# Patient Record
Sex: Female | Born: 1950 | Race: White | Hispanic: No | Smoking: Former smoker
Health system: Southern US, Community
[De-identification: ages and names within clinical notes are randomized; demographics above are authoritative.]

## PROBLEM LIST (undated history)

## (undated) DIAGNOSIS — I639 Cerebral infarction, unspecified: Secondary | ICD-10-CM

## (undated) DIAGNOSIS — C50919 Malignant neoplasm of unspecified site of unspecified female breast: Secondary | ICD-10-CM

## (undated) DIAGNOSIS — I219 Acute myocardial infarction, unspecified: Secondary | ICD-10-CM

## (undated) DIAGNOSIS — F0390 Unspecified dementia without behavioral disturbance: Secondary | ICD-10-CM

---

## 2010-04-08 ENCOUNTER — Inpatient Hospital Stay (HOSPITAL_COMMUNITY)
Admission: RE | Admit: 2010-04-08 | Discharge: 2010-04-09 | Payer: Self-pay | Source: Home / Self Care | Attending: Neurosurgery | Admitting: Neurosurgery

## 2010-05-22 ENCOUNTER — Other Ambulatory Visit: Payer: Self-pay | Admitting: Neurosurgery

## 2010-05-22 DIAGNOSIS — Z9889 Other specified postprocedural states: Secondary | ICD-10-CM

## 2010-06-04 ENCOUNTER — Ambulatory Visit
Admission: RE | Admit: 2010-06-04 | Discharge: 2010-06-04 | Disposition: A | Discharge status: Home / Self Care | Payer: Medicare Other | Source: Ambulatory Visit | Attending: Neurosurgery | Admitting: Neurosurgery

## 2010-06-04 DIAGNOSIS — Z9889 Other specified postprocedural states: Secondary | ICD-10-CM

## 2010-07-08 LAB — BASIC METABOLIC PANEL
BUN: 12 mg/dL (ref 6–23)
CO2: 28 mEq/L (ref 19–32)
Chloride: 107 mEq/L (ref 96–112)
Creatinine, Ser: 0.88 mg/dL (ref 0.4–1.2)
Glucose, Bld: 96 mg/dL (ref 70–99)
Potassium: 4.5 mEq/L (ref 3.5–5.1)

## 2010-07-08 LAB — CBC
HCT: 41.1 % (ref 36.0–46.0)
MCH: 28.9 pg (ref 26.0–34.0)
MCV: 88 fL (ref 78.0–100.0)
Platelets: 257 10*3/uL (ref 150–400)
RDW: 13.7 % (ref 11.5–15.5)
WBC: 11.9 10*3/uL — ABNORMAL HIGH (ref 4.0–10.5)

## 2010-07-08 LAB — TYPE AND SCREEN: ABO/RH(D): O NEG

## 2010-07-08 LAB — DIFFERENTIAL
Basophils Absolute: 0.1 10*3/uL (ref 0.0–0.1)
Eosinophils Absolute: 0.4 10*3/uL (ref 0.0–0.7)
Eosinophils Relative: 3 % (ref 0–5)
Lymphocytes Relative: 41 % (ref 12–46)
Lymphs Abs: 4.9 10*3/uL — ABNORMAL HIGH (ref 0.7–4.0)
Monocytes Absolute: 0.9 10*3/uL (ref 0.1–1.0)

## 2010-07-08 LAB — SURGICAL PCR SCREEN: Staphylococcus aureus: NEGATIVE

## 2010-08-06 NOTE — Discharge Summary (Signed)
  NAMEIZORA, BENN                ACCOUNT NO.:  [id number]  MEDICAL RECORD NO.:  [id number]           PATIENT TYPE:  I  LOCATION:  3534                         FACILITY:  [facility]  PHYSICIAN:  Kathaleen Maser. Santa Abdelrahman, M.D.    DATE OF BIRTH:  February 21, 1951  DATE OF ADMISSION:  04/08/2010 DATE OF DISCHARGE:  04/09/2010                              DISCHARGE SUMMARY   ATTENDING PHYSICIAN:  Sherilyn Cooter A. Remmington Teters, MD  SERVICE:  Neurosurgery  FINAL DIAGNOSIS:  Cervical stenosis with myelopathy.  HISTORY OF PRESENT ILLNESS:  Ms. Kolle is a 60 year old female with cervical stenosis who presents now for a C4-5, C5-6, and C6-7 anterior cervical decompression and fusion.  HOSPITAL COURSE:  The patient admitted to the hospital where she underwent uncomplicated three-level anterior cervical decompression and fusion.  Postoperatively, the patient has done well.  She is ready be discharged on her first postoperative day.  She is tolerating regular diet, ambulating without difficulty, and neurologically intact.  CONDITION ON DISCHARGE:  Improved.  DISCHARGE DISPOSITION:  The patient will follow up in my office in 1 week.          ______________________________ Kathaleen Maser. Kiriana Worthington, M.D.     HAP/MEDQ  D:  07/24/2010  T:  07/25/2010  Job:  [id number]  Electronically Signed by Julio Sicks M.D. on 08/06/2010 03:23:42 PM

## 2011-05-12 DIAGNOSIS — R5381 Other malaise: Secondary | ICD-10-CM | POA: Diagnosis not present

## 2011-05-12 DIAGNOSIS — R5383 Other fatigue: Secondary | ICD-10-CM | POA: Diagnosis not present

## 2011-05-12 DIAGNOSIS — M159 Polyosteoarthritis, unspecified: Secondary | ICD-10-CM | POA: Diagnosis not present

## 2011-05-12 DIAGNOSIS — J449 Chronic obstructive pulmonary disease, unspecified: Secondary | ICD-10-CM | POA: Diagnosis not present

## 2011-06-01 DIAGNOSIS — F331 Major depressive disorder, recurrent, moderate: Secondary | ICD-10-CM | POA: Diagnosis not present

## 2011-06-02 DIAGNOSIS — I059 Rheumatic mitral valve disease, unspecified: Secondary | ICD-10-CM | POA: Diagnosis not present

## 2011-06-02 DIAGNOSIS — G609 Hereditary and idiopathic neuropathy, unspecified: Secondary | ICD-10-CM | POA: Diagnosis not present

## 2011-06-02 DIAGNOSIS — E785 Hyperlipidemia, unspecified: Secondary | ICD-10-CM | POA: Diagnosis not present

## 2011-06-02 DIAGNOSIS — I2510 Atherosclerotic heart disease of native coronary artery without angina pectoris: Secondary | ICD-10-CM | POA: Diagnosis not present

## 2011-06-18 DIAGNOSIS — J449 Chronic obstructive pulmonary disease, unspecified: Secondary | ICD-10-CM | POA: Diagnosis not present

## 2011-06-18 DIAGNOSIS — R5383 Other fatigue: Secondary | ICD-10-CM | POA: Diagnosis not present

## 2011-06-18 DIAGNOSIS — I10 Essential (primary) hypertension: Secondary | ICD-10-CM | POA: Diagnosis not present

## 2011-06-18 DIAGNOSIS — Z0000 Encounter for general adult medical examination without abnormal findings: Secondary | ICD-10-CM | POA: Diagnosis not present

## 2011-06-18 DIAGNOSIS — R7301 Impaired fasting glucose: Secondary | ICD-10-CM | POA: Diagnosis not present

## 2011-06-18 DIAGNOSIS — E785 Hyperlipidemia, unspecified: Secondary | ICD-10-CM | POA: Diagnosis not present

## 2011-06-18 DIAGNOSIS — E559 Vitamin D deficiency, unspecified: Secondary | ICD-10-CM | POA: Diagnosis not present

## 2011-06-18 DIAGNOSIS — Z79899 Other long term (current) drug therapy: Secondary | ICD-10-CM | POA: Diagnosis not present

## 2011-06-24 DIAGNOSIS — M949 Disorder of cartilage, unspecified: Secondary | ICD-10-CM | POA: Diagnosis not present

## 2011-06-24 DIAGNOSIS — Z13820 Encounter for screening for osteoporosis: Secondary | ICD-10-CM | POA: Diagnosis not present

## 2011-06-24 DIAGNOSIS — M899 Disorder of bone, unspecified: Secondary | ICD-10-CM | POA: Diagnosis not present

## 2011-08-12 DIAGNOSIS — D0590 Unspecified type of carcinoma in situ of unspecified breast: Secondary | ICD-10-CM | POA: Diagnosis not present

## 2011-08-12 DIAGNOSIS — R928 Other abnormal and inconclusive findings on diagnostic imaging of breast: Secondary | ICD-10-CM | POA: Diagnosis not present

## 2011-08-14 DIAGNOSIS — R110 Nausea: Secondary | ICD-10-CM | POA: Diagnosis not present

## 2011-08-14 DIAGNOSIS — G43109 Migraine with aura, not intractable, without status migrainosus: Secondary | ICD-10-CM | POA: Diagnosis not present

## 2011-08-14 DIAGNOSIS — M47812 Spondylosis without myelopathy or radiculopathy, cervical region: Secondary | ICD-10-CM | POA: Diagnosis not present

## 2011-08-14 DIAGNOSIS — J449 Chronic obstructive pulmonary disease, unspecified: Secondary | ICD-10-CM | POA: Diagnosis not present

## 2011-08-18 DIAGNOSIS — C50919 Malignant neoplasm of unspecified site of unspecified female breast: Secondary | ICD-10-CM | POA: Diagnosis not present

## 2011-08-19 DIAGNOSIS — C50919 Malignant neoplasm of unspecified site of unspecified female breast: Secondary | ICD-10-CM | POA: Diagnosis not present

## 2011-08-27 DIAGNOSIS — C50919 Malignant neoplasm of unspecified site of unspecified female breast: Secondary | ICD-10-CM | POA: Diagnosis not present

## 2011-08-28 DIAGNOSIS — F17200 Nicotine dependence, unspecified, uncomplicated: Secondary | ICD-10-CM | POA: Diagnosis not present

## 2011-08-28 DIAGNOSIS — I2510 Atherosclerotic heart disease of native coronary artery without angina pectoris: Secondary | ICD-10-CM | POA: Diagnosis not present

## 2011-08-28 DIAGNOSIS — I10 Essential (primary) hypertension: Secondary | ICD-10-CM | POA: Diagnosis not present

## 2011-08-28 DIAGNOSIS — Z01810 Encounter for preprocedural cardiovascular examination: Secondary | ICD-10-CM | POA: Diagnosis not present

## 2011-08-28 DIAGNOSIS — I059 Rheumatic mitral valve disease, unspecified: Secondary | ICD-10-CM | POA: Diagnosis not present

## 2011-08-28 DIAGNOSIS — E785 Hyperlipidemia, unspecified: Secondary | ICD-10-CM | POA: Diagnosis not present

## 2011-09-04 DIAGNOSIS — I059 Rheumatic mitral valve disease, unspecified: Secondary | ICD-10-CM | POA: Diagnosis not present

## 2011-09-04 DIAGNOSIS — I2510 Atherosclerotic heart disease of native coronary artery without angina pectoris: Secondary | ICD-10-CM | POA: Diagnosis not present

## 2011-09-04 DIAGNOSIS — Z01810 Encounter for preprocedural cardiovascular examination: Secondary | ICD-10-CM | POA: Diagnosis not present

## 2011-09-11 DIAGNOSIS — J449 Chronic obstructive pulmonary disease, unspecified: Secondary | ICD-10-CM | POA: Diagnosis not present

## 2011-09-11 DIAGNOSIS — E785 Hyperlipidemia, unspecified: Secondary | ICD-10-CM | POA: Diagnosis not present

## 2011-09-11 DIAGNOSIS — K219 Gastro-esophageal reflux disease without esophagitis: Secondary | ICD-10-CM | POA: Diagnosis not present

## 2011-09-11 DIAGNOSIS — Z7982 Long term (current) use of aspirin: Secondary | ICD-10-CM | POA: Diagnosis not present

## 2011-09-11 DIAGNOSIS — D0590 Unspecified type of carcinoma in situ of unspecified breast: Secondary | ICD-10-CM | POA: Diagnosis not present

## 2011-09-11 DIAGNOSIS — I252 Old myocardial infarction: Secondary | ICD-10-CM | POA: Diagnosis not present

## 2011-09-11 DIAGNOSIS — F17200 Nicotine dependence, unspecified, uncomplicated: Secondary | ICD-10-CM | POA: Diagnosis not present

## 2011-09-11 DIAGNOSIS — I10 Essential (primary) hypertension: Secondary | ICD-10-CM | POA: Diagnosis not present

## 2011-10-06 DIAGNOSIS — M25529 Pain in unspecified elbow: Secondary | ICD-10-CM | POA: Diagnosis not present

## 2011-10-06 DIAGNOSIS — J209 Acute bronchitis, unspecified: Secondary | ICD-10-CM | POA: Diagnosis not present

## 2011-10-15 DIAGNOSIS — K589 Irritable bowel syndrome without diarrhea: Secondary | ICD-10-CM | POA: Diagnosis not present

## 2011-10-15 DIAGNOSIS — Z79899 Other long term (current) drug therapy: Secondary | ICD-10-CM | POA: Diagnosis not present

## 2011-10-15 DIAGNOSIS — C50419 Malignant neoplasm of upper-outer quadrant of unspecified female breast: Secondary | ICD-10-CM | POA: Diagnosis not present

## 2011-10-15 DIAGNOSIS — D0590 Unspecified type of carcinoma in situ of unspecified breast: Secondary | ICD-10-CM | POA: Diagnosis not present

## 2011-10-15 DIAGNOSIS — I10 Essential (primary) hypertension: Secondary | ICD-10-CM | POA: Diagnosis not present

## 2011-10-15 DIAGNOSIS — E785 Hyperlipidemia, unspecified: Secondary | ICD-10-CM | POA: Diagnosis not present

## 2011-10-15 DIAGNOSIS — Z803 Family history of malignant neoplasm of breast: Secondary | ICD-10-CM | POA: Diagnosis not present

## 2011-10-15 DIAGNOSIS — K219 Gastro-esophageal reflux disease without esophagitis: Secondary | ICD-10-CM | POA: Diagnosis not present

## 2011-10-15 DIAGNOSIS — I059 Rheumatic mitral valve disease, unspecified: Secondary | ICD-10-CM | POA: Diagnosis not present

## 2011-10-15 DIAGNOSIS — Z7982 Long term (current) use of aspirin: Secondary | ICD-10-CM | POA: Diagnosis not present

## 2011-10-15 DIAGNOSIS — F17200 Nicotine dependence, unspecified, uncomplicated: Secondary | ICD-10-CM | POA: Diagnosis not present

## 2011-10-15 DIAGNOSIS — I252 Old myocardial infarction: Secondary | ICD-10-CM | POA: Diagnosis not present

## 2011-10-19 DIAGNOSIS — C50419 Malignant neoplasm of upper-outer quadrant of unspecified female breast: Secondary | ICD-10-CM | POA: Diagnosis not present

## 2011-10-19 DIAGNOSIS — M899 Disorder of bone, unspecified: Secondary | ICD-10-CM | POA: Diagnosis not present

## 2011-10-19 DIAGNOSIS — IMO0002 Reserved for concepts with insufficient information to code with codable children: Secondary | ICD-10-CM | POA: Diagnosis not present

## 2011-10-19 DIAGNOSIS — M949 Disorder of cartilage, unspecified: Secondary | ICD-10-CM | POA: Diagnosis not present

## 2011-10-20 DIAGNOSIS — D649 Anemia, unspecified: Secondary | ICD-10-CM | POA: Diagnosis not present

## 2011-10-20 DIAGNOSIS — R5381 Other malaise: Secondary | ICD-10-CM | POA: Diagnosis not present

## 2011-10-20 DIAGNOSIS — N644 Mastodynia: Secondary | ICD-10-CM | POA: Diagnosis not present

## 2011-10-20 DIAGNOSIS — R5383 Other fatigue: Secondary | ICD-10-CM | POA: Diagnosis not present

## 2011-10-22 DIAGNOSIS — N644 Mastodynia: Secondary | ICD-10-CM | POA: Diagnosis not present

## 2011-10-22 DIAGNOSIS — IMO0002 Reserved for concepts with insufficient information to code with codable children: Secondary | ICD-10-CM | POA: Diagnosis not present

## 2011-11-12 DIAGNOSIS — C50419 Malignant neoplasm of upper-outer quadrant of unspecified female breast: Secondary | ICD-10-CM | POA: Diagnosis not present

## 2011-11-12 DIAGNOSIS — D0590 Unspecified type of carcinoma in situ of unspecified breast: Secondary | ICD-10-CM | POA: Diagnosis not present

## 2011-11-12 DIAGNOSIS — Z510 Encounter for antineoplastic radiation therapy: Secondary | ICD-10-CM | POA: Diagnosis not present

## 2011-11-17 DIAGNOSIS — C50419 Malignant neoplasm of upper-outer quadrant of unspecified female breast: Secondary | ICD-10-CM | POA: Diagnosis not present

## 2011-11-17 DIAGNOSIS — Z510 Encounter for antineoplastic radiation therapy: Secondary | ICD-10-CM | POA: Diagnosis not present

## 2011-11-17 DIAGNOSIS — D0590 Unspecified type of carcinoma in situ of unspecified breast: Secondary | ICD-10-CM | POA: Diagnosis not present

## 2011-11-18 DIAGNOSIS — C50419 Malignant neoplasm of upper-outer quadrant of unspecified female breast: Secondary | ICD-10-CM | POA: Diagnosis not present

## 2011-11-18 DIAGNOSIS — D0590 Unspecified type of carcinoma in situ of unspecified breast: Secondary | ICD-10-CM | POA: Diagnosis not present

## 2011-11-18 DIAGNOSIS — Z510 Encounter for antineoplastic radiation therapy: Secondary | ICD-10-CM | POA: Diagnosis not present

## 2011-11-23 DIAGNOSIS — Z510 Encounter for antineoplastic radiation therapy: Secondary | ICD-10-CM | POA: Diagnosis not present

## 2011-11-23 DIAGNOSIS — C50419 Malignant neoplasm of upper-outer quadrant of unspecified female breast: Secondary | ICD-10-CM | POA: Diagnosis not present

## 2011-11-23 DIAGNOSIS — D0590 Unspecified type of carcinoma in situ of unspecified breast: Secondary | ICD-10-CM | POA: Diagnosis not present

## 2011-11-24 DIAGNOSIS — Z510 Encounter for antineoplastic radiation therapy: Secondary | ICD-10-CM | POA: Diagnosis not present

## 2011-11-24 DIAGNOSIS — D0590 Unspecified type of carcinoma in situ of unspecified breast: Secondary | ICD-10-CM | POA: Diagnosis not present

## 2011-11-25 DIAGNOSIS — D0590 Unspecified type of carcinoma in situ of unspecified breast: Secondary | ICD-10-CM | POA: Diagnosis not present

## 2011-11-25 DIAGNOSIS — Z510 Encounter for antineoplastic radiation therapy: Secondary | ICD-10-CM | POA: Diagnosis not present

## 2011-11-26 DIAGNOSIS — Z510 Encounter for antineoplastic radiation therapy: Secondary | ICD-10-CM | POA: Diagnosis not present

## 2011-11-26 DIAGNOSIS — D0590 Unspecified type of carcinoma in situ of unspecified breast: Secondary | ICD-10-CM | POA: Diagnosis not present

## 2011-11-27 DIAGNOSIS — D0590 Unspecified type of carcinoma in situ of unspecified breast: Secondary | ICD-10-CM | POA: Diagnosis not present

## 2011-11-27 DIAGNOSIS — Z510 Encounter for antineoplastic radiation therapy: Secondary | ICD-10-CM | POA: Diagnosis not present

## 2011-11-30 DIAGNOSIS — D0590 Unspecified type of carcinoma in situ of unspecified breast: Secondary | ICD-10-CM | POA: Diagnosis not present

## 2011-11-30 DIAGNOSIS — C50419 Malignant neoplasm of upper-outer quadrant of unspecified female breast: Secondary | ICD-10-CM | POA: Diagnosis not present

## 2011-11-30 DIAGNOSIS — Z510 Encounter for antineoplastic radiation therapy: Secondary | ICD-10-CM | POA: Diagnosis not present

## 2011-12-01 DIAGNOSIS — Z510 Encounter for antineoplastic radiation therapy: Secondary | ICD-10-CM | POA: Diagnosis not present

## 2011-12-01 DIAGNOSIS — D0590 Unspecified type of carcinoma in situ of unspecified breast: Secondary | ICD-10-CM | POA: Diagnosis not present

## 2011-12-02 DIAGNOSIS — Z510 Encounter for antineoplastic radiation therapy: Secondary | ICD-10-CM | POA: Diagnosis not present

## 2011-12-02 DIAGNOSIS — D0590 Unspecified type of carcinoma in situ of unspecified breast: Secondary | ICD-10-CM | POA: Diagnosis not present

## 2011-12-03 DIAGNOSIS — Z510 Encounter for antineoplastic radiation therapy: Secondary | ICD-10-CM | POA: Diagnosis not present

## 2011-12-03 DIAGNOSIS — D0590 Unspecified type of carcinoma in situ of unspecified breast: Secondary | ICD-10-CM | POA: Diagnosis not present

## 2011-12-04 DIAGNOSIS — Z510 Encounter for antineoplastic radiation therapy: Secondary | ICD-10-CM | POA: Diagnosis not present

## 2011-12-04 DIAGNOSIS — D0590 Unspecified type of carcinoma in situ of unspecified breast: Secondary | ICD-10-CM | POA: Diagnosis not present

## 2011-12-07 DIAGNOSIS — D0590 Unspecified type of carcinoma in situ of unspecified breast: Secondary | ICD-10-CM | POA: Diagnosis not present

## 2011-12-07 DIAGNOSIS — C50419 Malignant neoplasm of upper-outer quadrant of unspecified female breast: Secondary | ICD-10-CM | POA: Diagnosis not present

## 2011-12-07 DIAGNOSIS — Z510 Encounter for antineoplastic radiation therapy: Secondary | ICD-10-CM | POA: Diagnosis not present

## 2011-12-08 DIAGNOSIS — D0590 Unspecified type of carcinoma in situ of unspecified breast: Secondary | ICD-10-CM | POA: Diagnosis not present

## 2011-12-08 DIAGNOSIS — Z510 Encounter for antineoplastic radiation therapy: Secondary | ICD-10-CM | POA: Diagnosis not present

## 2011-12-09 DIAGNOSIS — Z510 Encounter for antineoplastic radiation therapy: Secondary | ICD-10-CM | POA: Diagnosis not present

## 2011-12-09 DIAGNOSIS — D0590 Unspecified type of carcinoma in situ of unspecified breast: Secondary | ICD-10-CM | POA: Diagnosis not present

## 2011-12-10 DIAGNOSIS — Z510 Encounter for antineoplastic radiation therapy: Secondary | ICD-10-CM | POA: Diagnosis not present

## 2011-12-10 DIAGNOSIS — D0590 Unspecified type of carcinoma in situ of unspecified breast: Secondary | ICD-10-CM | POA: Diagnosis not present

## 2011-12-11 DIAGNOSIS — Z510 Encounter for antineoplastic radiation therapy: Secondary | ICD-10-CM | POA: Diagnosis not present

## 2011-12-11 DIAGNOSIS — D0590 Unspecified type of carcinoma in situ of unspecified breast: Secondary | ICD-10-CM | POA: Diagnosis not present

## 2011-12-14 DIAGNOSIS — D0590 Unspecified type of carcinoma in situ of unspecified breast: Secondary | ICD-10-CM | POA: Diagnosis not present

## 2011-12-14 DIAGNOSIS — C50419 Malignant neoplasm of upper-outer quadrant of unspecified female breast: Secondary | ICD-10-CM | POA: Diagnosis not present

## 2011-12-14 DIAGNOSIS — Z510 Encounter for antineoplastic radiation therapy: Secondary | ICD-10-CM | POA: Diagnosis not present

## 2011-12-15 DIAGNOSIS — Z510 Encounter for antineoplastic radiation therapy: Secondary | ICD-10-CM | POA: Diagnosis not present

## 2011-12-15 DIAGNOSIS — D0590 Unspecified type of carcinoma in situ of unspecified breast: Secondary | ICD-10-CM | POA: Diagnosis not present

## 2011-12-16 DIAGNOSIS — D0590 Unspecified type of carcinoma in situ of unspecified breast: Secondary | ICD-10-CM | POA: Diagnosis not present

## 2011-12-16 DIAGNOSIS — Z510 Encounter for antineoplastic radiation therapy: Secondary | ICD-10-CM | POA: Diagnosis not present

## 2011-12-17 DIAGNOSIS — D0590 Unspecified type of carcinoma in situ of unspecified breast: Secondary | ICD-10-CM | POA: Diagnosis not present

## 2011-12-17 DIAGNOSIS — Z510 Encounter for antineoplastic radiation therapy: Secondary | ICD-10-CM | POA: Diagnosis not present

## 2011-12-18 DIAGNOSIS — D0590 Unspecified type of carcinoma in situ of unspecified breast: Secondary | ICD-10-CM | POA: Diagnosis not present

## 2011-12-18 DIAGNOSIS — Z510 Encounter for antineoplastic radiation therapy: Secondary | ICD-10-CM | POA: Diagnosis not present

## 2011-12-21 DIAGNOSIS — Z510 Encounter for antineoplastic radiation therapy: Secondary | ICD-10-CM | POA: Diagnosis not present

## 2011-12-21 DIAGNOSIS — C50419 Malignant neoplasm of upper-outer quadrant of unspecified female breast: Secondary | ICD-10-CM | POA: Diagnosis not present

## 2011-12-21 DIAGNOSIS — D0590 Unspecified type of carcinoma in situ of unspecified breast: Secondary | ICD-10-CM | POA: Diagnosis not present

## 2011-12-22 DIAGNOSIS — D0590 Unspecified type of carcinoma in situ of unspecified breast: Secondary | ICD-10-CM | POA: Diagnosis not present

## 2011-12-22 DIAGNOSIS — Z510 Encounter for antineoplastic radiation therapy: Secondary | ICD-10-CM | POA: Diagnosis not present

## 2011-12-23 DIAGNOSIS — Z510 Encounter for antineoplastic radiation therapy: Secondary | ICD-10-CM | POA: Diagnosis not present

## 2011-12-23 DIAGNOSIS — D0590 Unspecified type of carcinoma in situ of unspecified breast: Secondary | ICD-10-CM | POA: Diagnosis not present

## 2011-12-24 DIAGNOSIS — Z853 Personal history of malignant neoplasm of breast: Secondary | ICD-10-CM | POA: Diagnosis not present

## 2011-12-24 DIAGNOSIS — D0590 Unspecified type of carcinoma in situ of unspecified breast: Secondary | ICD-10-CM | POA: Diagnosis not present

## 2011-12-24 DIAGNOSIS — Z510 Encounter for antineoplastic radiation therapy: Secondary | ICD-10-CM | POA: Diagnosis not present

## 2011-12-25 DIAGNOSIS — Z510 Encounter for antineoplastic radiation therapy: Secondary | ICD-10-CM | POA: Diagnosis not present

## 2011-12-25 DIAGNOSIS — D0590 Unspecified type of carcinoma in situ of unspecified breast: Secondary | ICD-10-CM | POA: Diagnosis not present

## 2011-12-29 DIAGNOSIS — D0590 Unspecified type of carcinoma in situ of unspecified breast: Secondary | ICD-10-CM | POA: Diagnosis not present

## 2011-12-29 DIAGNOSIS — Z510 Encounter for antineoplastic radiation therapy: Secondary | ICD-10-CM | POA: Diagnosis not present

## 2011-12-29 DIAGNOSIS — C50419 Malignant neoplasm of upper-outer quadrant of unspecified female breast: Secondary | ICD-10-CM | POA: Diagnosis not present

## 2011-12-30 DIAGNOSIS — J449 Chronic obstructive pulmonary disease, unspecified: Secondary | ICD-10-CM | POA: Diagnosis not present

## 2011-12-30 DIAGNOSIS — D0590 Unspecified type of carcinoma in situ of unspecified breast: Secondary | ICD-10-CM | POA: Diagnosis not present

## 2011-12-30 DIAGNOSIS — Z510 Encounter for antineoplastic radiation therapy: Secondary | ICD-10-CM | POA: Diagnosis not present

## 2011-12-30 DIAGNOSIS — R5383 Other fatigue: Secondary | ICD-10-CM | POA: Diagnosis not present

## 2011-12-30 DIAGNOSIS — J309 Allergic rhinitis, unspecified: Secondary | ICD-10-CM | POA: Diagnosis not present

## 2011-12-31 DIAGNOSIS — Z510 Encounter for antineoplastic radiation therapy: Secondary | ICD-10-CM | POA: Diagnosis not present

## 2011-12-31 DIAGNOSIS — D0590 Unspecified type of carcinoma in situ of unspecified breast: Secondary | ICD-10-CM | POA: Diagnosis not present

## 2012-01-07 DIAGNOSIS — T8189XA Other complications of procedures, not elsewhere classified, initial encounter: Secondary | ICD-10-CM | POA: Diagnosis not present

## 2012-01-07 DIAGNOSIS — M2010 Hallux valgus (acquired), unspecified foot: Secondary | ICD-10-CM | POA: Diagnosis not present

## 2012-01-07 DIAGNOSIS — B079 Viral wart, unspecified: Secondary | ICD-10-CM | POA: Diagnosis not present

## 2012-01-13 DIAGNOSIS — T8189XA Other complications of procedures, not elsewhere classified, initial encounter: Secondary | ICD-10-CM | POA: Diagnosis not present

## 2012-01-20 DIAGNOSIS — B070 Plantar wart: Secondary | ICD-10-CM | POA: Diagnosis not present

## 2012-01-20 DIAGNOSIS — M79609 Pain in unspecified limb: Secondary | ICD-10-CM | POA: Diagnosis not present

## 2012-01-21 DIAGNOSIS — F17200 Nicotine dependence, unspecified, uncomplicated: Secondary | ICD-10-CM | POA: Diagnosis not present

## 2012-01-21 DIAGNOSIS — M899 Disorder of bone, unspecified: Secondary | ICD-10-CM | POA: Diagnosis not present

## 2012-01-21 DIAGNOSIS — C50419 Malignant neoplasm of upper-outer quadrant of unspecified female breast: Secondary | ICD-10-CM | POA: Diagnosis not present

## 2012-01-22 DIAGNOSIS — J209 Acute bronchitis, unspecified: Secondary | ICD-10-CM | POA: Diagnosis not present

## 2012-01-22 DIAGNOSIS — C50919 Malignant neoplasm of unspecified site of unspecified female breast: Secondary | ICD-10-CM | POA: Diagnosis not present

## 2012-01-26 DIAGNOSIS — R0609 Other forms of dyspnea: Secondary | ICD-10-CM | POA: Diagnosis not present

## 2012-01-26 DIAGNOSIS — C50419 Malignant neoplasm of upper-outer quadrant of unspecified female breast: Secondary | ICD-10-CM | POA: Diagnosis not present

## 2012-01-26 DIAGNOSIS — R0989 Other specified symptoms and signs involving the circulatory and respiratory systems: Secondary | ICD-10-CM | POA: Diagnosis not present

## 2012-01-29 DIAGNOSIS — E785 Hyperlipidemia, unspecified: Secondary | ICD-10-CM | POA: Diagnosis not present

## 2012-01-29 DIAGNOSIS — Z7982 Long term (current) use of aspirin: Secondary | ICD-10-CM | POA: Diagnosis not present

## 2012-01-29 DIAGNOSIS — D0590 Unspecified type of carcinoma in situ of unspecified breast: Secondary | ICD-10-CM | POA: Diagnosis not present

## 2012-01-29 DIAGNOSIS — N61 Mastitis without abscess: Secondary | ICD-10-CM | POA: Diagnosis not present

## 2012-01-29 DIAGNOSIS — I2510 Atherosclerotic heart disease of native coronary artery without angina pectoris: Secondary | ICD-10-CM | POA: Diagnosis not present

## 2012-01-29 DIAGNOSIS — G603 Idiopathic progressive neuropathy: Secondary | ICD-10-CM | POA: Diagnosis not present

## 2012-01-29 DIAGNOSIS — I059 Rheumatic mitral valve disease, unspecified: Secondary | ICD-10-CM | POA: Diagnosis not present

## 2012-01-29 DIAGNOSIS — F17200 Nicotine dependence, unspecified, uncomplicated: Secondary | ICD-10-CM | POA: Diagnosis not present

## 2012-01-29 DIAGNOSIS — L98499 Non-pressure chronic ulcer of skin of other sites with unspecified severity: Secondary | ICD-10-CM | POA: Diagnosis not present

## 2012-01-29 DIAGNOSIS — Z79899 Other long term (current) drug therapy: Secondary | ICD-10-CM | POA: Diagnosis not present

## 2012-01-29 DIAGNOSIS — G609 Hereditary and idiopathic neuropathy, unspecified: Secondary | ICD-10-CM | POA: Diagnosis not present

## 2012-01-29 DIAGNOSIS — I10 Essential (primary) hypertension: Secondary | ICD-10-CM | POA: Diagnosis not present

## 2012-01-29 DIAGNOSIS — T8131XA Disruption of external operation (surgical) wound, not elsewhere classified, initial encounter: Secondary | ICD-10-CM | POA: Diagnosis not present

## 2012-02-02 DIAGNOSIS — I2510 Atherosclerotic heart disease of native coronary artery without angina pectoris: Secondary | ICD-10-CM | POA: Diagnosis not present

## 2012-02-02 DIAGNOSIS — I10 Essential (primary) hypertension: Secondary | ICD-10-CM | POA: Diagnosis not present

## 2012-02-02 DIAGNOSIS — L98499 Non-pressure chronic ulcer of skin of other sites with unspecified severity: Secondary | ICD-10-CM | POA: Diagnosis not present

## 2012-02-02 DIAGNOSIS — T8131XA Disruption of external operation (surgical) wound, not elsewhere classified, initial encounter: Secondary | ICD-10-CM | POA: Diagnosis not present

## 2012-02-02 DIAGNOSIS — Z7982 Long term (current) use of aspirin: Secondary | ICD-10-CM | POA: Diagnosis not present

## 2012-02-02 DIAGNOSIS — N61 Mastitis without abscess: Secondary | ICD-10-CM | POA: Diagnosis not present

## 2012-02-03 DIAGNOSIS — B070 Plantar wart: Secondary | ICD-10-CM | POA: Diagnosis not present

## 2012-02-03 DIAGNOSIS — M79609 Pain in unspecified limb: Secondary | ICD-10-CM | POA: Diagnosis not present

## 2012-02-04 DIAGNOSIS — N61 Mastitis without abscess: Secondary | ICD-10-CM | POA: Diagnosis not present

## 2012-02-04 DIAGNOSIS — I2510 Atherosclerotic heart disease of native coronary artery without angina pectoris: Secondary | ICD-10-CM | POA: Diagnosis not present

## 2012-02-04 DIAGNOSIS — L98499 Non-pressure chronic ulcer of skin of other sites with unspecified severity: Secondary | ICD-10-CM | POA: Diagnosis not present

## 2012-02-04 DIAGNOSIS — I10 Essential (primary) hypertension: Secondary | ICD-10-CM | POA: Diagnosis not present

## 2012-02-04 DIAGNOSIS — T8131XA Disruption of external operation (surgical) wound, not elsewhere classified, initial encounter: Secondary | ICD-10-CM | POA: Diagnosis not present

## 2012-02-04 DIAGNOSIS — Z7982 Long term (current) use of aspirin: Secondary | ICD-10-CM | POA: Diagnosis not present

## 2012-02-11 DIAGNOSIS — T8131XA Disruption of external operation (surgical) wound, not elsewhere classified, initial encounter: Secondary | ICD-10-CM | POA: Diagnosis not present

## 2012-02-11 DIAGNOSIS — I10 Essential (primary) hypertension: Secondary | ICD-10-CM | POA: Diagnosis not present

## 2012-02-11 DIAGNOSIS — L98499 Non-pressure chronic ulcer of skin of other sites with unspecified severity: Secondary | ICD-10-CM | POA: Diagnosis not present

## 2012-02-11 DIAGNOSIS — I2510 Atherosclerotic heart disease of native coronary artery without angina pectoris: Secondary | ICD-10-CM | POA: Diagnosis not present

## 2012-02-11 DIAGNOSIS — Z7982 Long term (current) use of aspirin: Secondary | ICD-10-CM | POA: Diagnosis not present

## 2012-02-11 DIAGNOSIS — N61 Mastitis without abscess: Secondary | ICD-10-CM | POA: Diagnosis not present

## 2012-02-16 DIAGNOSIS — L98499 Non-pressure chronic ulcer of skin of other sites with unspecified severity: Secondary | ICD-10-CM | POA: Diagnosis not present

## 2012-02-16 DIAGNOSIS — Z7982 Long term (current) use of aspirin: Secondary | ICD-10-CM | POA: Diagnosis not present

## 2012-02-16 DIAGNOSIS — N61 Mastitis without abscess: Secondary | ICD-10-CM | POA: Diagnosis not present

## 2012-02-16 DIAGNOSIS — T8131XA Disruption of external operation (surgical) wound, not elsewhere classified, initial encounter: Secondary | ICD-10-CM | POA: Diagnosis not present

## 2012-02-16 DIAGNOSIS — I10 Essential (primary) hypertension: Secondary | ICD-10-CM | POA: Diagnosis not present

## 2012-02-16 DIAGNOSIS — I2510 Atherosclerotic heart disease of native coronary artery without angina pectoris: Secondary | ICD-10-CM | POA: Diagnosis not present

## 2012-02-17 DIAGNOSIS — B070 Plantar wart: Secondary | ICD-10-CM | POA: Diagnosis not present

## 2012-02-17 DIAGNOSIS — M79609 Pain in unspecified limb: Secondary | ICD-10-CM | POA: Diagnosis not present

## 2012-02-24 DIAGNOSIS — I2510 Atherosclerotic heart disease of native coronary artery without angina pectoris: Secondary | ICD-10-CM | POA: Diagnosis not present

## 2012-02-24 DIAGNOSIS — Z7982 Long term (current) use of aspirin: Secondary | ICD-10-CM | POA: Diagnosis not present

## 2012-02-24 DIAGNOSIS — I10 Essential (primary) hypertension: Secondary | ICD-10-CM | POA: Diagnosis not present

## 2012-02-24 DIAGNOSIS — N61 Mastitis without abscess: Secondary | ICD-10-CM | POA: Diagnosis not present

## 2012-02-24 DIAGNOSIS — T8131XA Disruption of external operation (surgical) wound, not elsewhere classified, initial encounter: Secondary | ICD-10-CM | POA: Diagnosis not present

## 2012-02-24 DIAGNOSIS — L98499 Non-pressure chronic ulcer of skin of other sites with unspecified severity: Secondary | ICD-10-CM | POA: Diagnosis not present

## 2012-03-02 DIAGNOSIS — E785 Hyperlipidemia, unspecified: Secondary | ICD-10-CM | POA: Diagnosis not present

## 2012-03-02 DIAGNOSIS — D0590 Unspecified type of carcinoma in situ of unspecified breast: Secondary | ICD-10-CM | POA: Diagnosis not present

## 2012-03-02 DIAGNOSIS — I059 Rheumatic mitral valve disease, unspecified: Secondary | ICD-10-CM | POA: Diagnosis not present

## 2012-03-02 DIAGNOSIS — G609 Hereditary and idiopathic neuropathy, unspecified: Secondary | ICD-10-CM | POA: Diagnosis not present

## 2012-03-02 DIAGNOSIS — G603 Idiopathic progressive neuropathy: Secondary | ICD-10-CM | POA: Diagnosis not present

## 2012-03-02 DIAGNOSIS — T8131XA Disruption of external operation (surgical) wound, not elsewhere classified, initial encounter: Secondary | ICD-10-CM | POA: Diagnosis not present

## 2012-03-02 DIAGNOSIS — I2510 Atherosclerotic heart disease of native coronary artery without angina pectoris: Secondary | ICD-10-CM | POA: Diagnosis not present

## 2012-03-02 DIAGNOSIS — L98499 Non-pressure chronic ulcer of skin of other sites with unspecified severity: Secondary | ICD-10-CM | POA: Diagnosis not present

## 2012-03-02 DIAGNOSIS — Z7982 Long term (current) use of aspirin: Secondary | ICD-10-CM | POA: Diagnosis not present

## 2012-03-02 DIAGNOSIS — Z79899 Other long term (current) drug therapy: Secondary | ICD-10-CM | POA: Diagnosis not present

## 2012-03-02 DIAGNOSIS — I10 Essential (primary) hypertension: Secondary | ICD-10-CM | POA: Diagnosis not present

## 2012-03-02 DIAGNOSIS — N61 Mastitis without abscess: Secondary | ICD-10-CM | POA: Diagnosis not present

## 2012-03-02 DIAGNOSIS — F17200 Nicotine dependence, unspecified, uncomplicated: Secondary | ICD-10-CM | POA: Diagnosis not present

## 2012-03-09 DIAGNOSIS — T8131XA Disruption of external operation (surgical) wound, not elsewhere classified, initial encounter: Secondary | ICD-10-CM | POA: Diagnosis not present

## 2012-03-09 DIAGNOSIS — N61 Mastitis without abscess: Secondary | ICD-10-CM | POA: Diagnosis not present

## 2012-03-09 DIAGNOSIS — I10 Essential (primary) hypertension: Secondary | ICD-10-CM | POA: Diagnosis not present

## 2012-03-09 DIAGNOSIS — L98499 Non-pressure chronic ulcer of skin of other sites with unspecified severity: Secondary | ICD-10-CM | POA: Diagnosis not present

## 2012-03-09 DIAGNOSIS — Z7982 Long term (current) use of aspirin: Secondary | ICD-10-CM | POA: Diagnosis not present

## 2012-03-09 DIAGNOSIS — I2510 Atherosclerotic heart disease of native coronary artery without angina pectoris: Secondary | ICD-10-CM | POA: Diagnosis not present

## 2012-03-16 DIAGNOSIS — I2510 Atherosclerotic heart disease of native coronary artery without angina pectoris: Secondary | ICD-10-CM | POA: Diagnosis not present

## 2012-03-16 DIAGNOSIS — L98499 Non-pressure chronic ulcer of skin of other sites with unspecified severity: Secondary | ICD-10-CM | POA: Diagnosis not present

## 2012-03-16 DIAGNOSIS — N61 Mastitis without abscess: Secondary | ICD-10-CM | POA: Diagnosis not present

## 2012-03-16 DIAGNOSIS — I10 Essential (primary) hypertension: Secondary | ICD-10-CM | POA: Diagnosis not present

## 2012-03-16 DIAGNOSIS — T8131XA Disruption of external operation (surgical) wound, not elsewhere classified, initial encounter: Secondary | ICD-10-CM | POA: Diagnosis not present

## 2012-03-16 DIAGNOSIS — Z7982 Long term (current) use of aspirin: Secondary | ICD-10-CM | POA: Diagnosis not present

## 2012-03-21 DIAGNOSIS — M79609 Pain in unspecified limb: Secondary | ICD-10-CM | POA: Diagnosis not present

## 2012-03-21 DIAGNOSIS — B070 Plantar wart: Secondary | ICD-10-CM | POA: Diagnosis not present

## 2012-03-30 DIAGNOSIS — J449 Chronic obstructive pulmonary disease, unspecified: Secondary | ICD-10-CM | POA: Diagnosis not present

## 2012-03-30 DIAGNOSIS — F411 Generalized anxiety disorder: Secondary | ICD-10-CM | POA: Diagnosis not present

## 2012-03-30 DIAGNOSIS — R61 Generalized hyperhidrosis: Secondary | ICD-10-CM | POA: Diagnosis not present

## 2012-03-30 DIAGNOSIS — Z6827 Body mass index (BMI) 27.0-27.9, adult: Secondary | ICD-10-CM | POA: Diagnosis not present

## 2012-03-31 DIAGNOSIS — C50919 Malignant neoplasm of unspecified site of unspecified female breast: Secondary | ICD-10-CM | POA: Diagnosis not present

## 2012-04-01 DIAGNOSIS — L98499 Non-pressure chronic ulcer of skin of other sites with unspecified severity: Secondary | ICD-10-CM | POA: Diagnosis not present

## 2012-04-01 DIAGNOSIS — N61 Mastitis without abscess: Secondary | ICD-10-CM | POA: Diagnosis not present

## 2012-04-01 DIAGNOSIS — G603 Idiopathic progressive neuropathy: Secondary | ICD-10-CM | POA: Diagnosis not present

## 2012-04-01 DIAGNOSIS — I2510 Atherosclerotic heart disease of native coronary artery without angina pectoris: Secondary | ICD-10-CM | POA: Diagnosis not present

## 2012-04-01 DIAGNOSIS — G609 Hereditary and idiopathic neuropathy, unspecified: Secondary | ICD-10-CM | POA: Diagnosis not present

## 2012-04-01 DIAGNOSIS — T8131XA Disruption of external operation (surgical) wound, not elsewhere classified, initial encounter: Secondary | ICD-10-CM | POA: Diagnosis not present

## 2012-04-01 DIAGNOSIS — Z79899 Other long term (current) drug therapy: Secondary | ICD-10-CM | POA: Diagnosis not present

## 2012-04-01 DIAGNOSIS — F17200 Nicotine dependence, unspecified, uncomplicated: Secondary | ICD-10-CM | POA: Diagnosis not present

## 2012-04-01 DIAGNOSIS — I10 Essential (primary) hypertension: Secondary | ICD-10-CM | POA: Diagnosis not present

## 2012-04-01 DIAGNOSIS — D0590 Unspecified type of carcinoma in situ of unspecified breast: Secondary | ICD-10-CM | POA: Diagnosis not present

## 2012-04-01 DIAGNOSIS — Z7982 Long term (current) use of aspirin: Secondary | ICD-10-CM | POA: Diagnosis not present

## 2012-04-01 DIAGNOSIS — E785 Hyperlipidemia, unspecified: Secondary | ICD-10-CM | POA: Diagnosis not present

## 2012-04-01 DIAGNOSIS — I059 Rheumatic mitral valve disease, unspecified: Secondary | ICD-10-CM | POA: Diagnosis not present

## 2012-04-07 DIAGNOSIS — N61 Mastitis without abscess: Secondary | ICD-10-CM | POA: Diagnosis not present

## 2012-04-07 DIAGNOSIS — L98499 Non-pressure chronic ulcer of skin of other sites with unspecified severity: Secondary | ICD-10-CM | POA: Diagnosis not present

## 2012-04-07 DIAGNOSIS — T8131XA Disruption of external operation (surgical) wound, not elsewhere classified, initial encounter: Secondary | ICD-10-CM | POA: Diagnosis not present

## 2012-04-07 DIAGNOSIS — Z7982 Long term (current) use of aspirin: Secondary | ICD-10-CM | POA: Diagnosis not present

## 2012-04-07 DIAGNOSIS — I2510 Atherosclerotic heart disease of native coronary artery without angina pectoris: Secondary | ICD-10-CM | POA: Diagnosis not present

## 2012-04-07 DIAGNOSIS — I10 Essential (primary) hypertension: Secondary | ICD-10-CM | POA: Diagnosis not present

## 2012-04-08 DIAGNOSIS — Z79899 Other long term (current) drug therapy: Secondary | ICD-10-CM | POA: Diagnosis not present

## 2012-04-08 DIAGNOSIS — D0590 Unspecified type of carcinoma in situ of unspecified breast: Secondary | ICD-10-CM | POA: Diagnosis not present

## 2012-04-08 DIAGNOSIS — Z7982 Long term (current) use of aspirin: Secondary | ICD-10-CM | POA: Diagnosis not present

## 2012-04-08 DIAGNOSIS — T8189XA Other complications of procedures, not elsewhere classified, initial encounter: Secondary | ICD-10-CM | POA: Diagnosis not present

## 2012-04-15 DIAGNOSIS — Z7982 Long term (current) use of aspirin: Secondary | ICD-10-CM | POA: Diagnosis not present

## 2012-04-15 DIAGNOSIS — L98499 Non-pressure chronic ulcer of skin of other sites with unspecified severity: Secondary | ICD-10-CM | POA: Diagnosis not present

## 2012-04-15 DIAGNOSIS — T8131XA Disruption of external operation (surgical) wound, not elsewhere classified, initial encounter: Secondary | ICD-10-CM | POA: Diagnosis not present

## 2012-04-15 DIAGNOSIS — I2510 Atherosclerotic heart disease of native coronary artery without angina pectoris: Secondary | ICD-10-CM | POA: Diagnosis not present

## 2012-04-15 DIAGNOSIS — I10 Essential (primary) hypertension: Secondary | ICD-10-CM | POA: Diagnosis not present

## 2012-04-15 DIAGNOSIS — N61 Mastitis without abscess: Secondary | ICD-10-CM | POA: Diagnosis not present

## 2012-04-22 DIAGNOSIS — N61 Mastitis without abscess: Secondary | ICD-10-CM | POA: Diagnosis not present

## 2012-04-22 DIAGNOSIS — I10 Essential (primary) hypertension: Secondary | ICD-10-CM | POA: Diagnosis not present

## 2012-04-22 DIAGNOSIS — I2510 Atherosclerotic heart disease of native coronary artery without angina pectoris: Secondary | ICD-10-CM | POA: Diagnosis not present

## 2012-04-22 DIAGNOSIS — L98499 Non-pressure chronic ulcer of skin of other sites with unspecified severity: Secondary | ICD-10-CM | POA: Diagnosis not present

## 2012-04-22 DIAGNOSIS — T8131XA Disruption of external operation (surgical) wound, not elsewhere classified, initial encounter: Secondary | ICD-10-CM | POA: Diagnosis not present

## 2012-04-22 DIAGNOSIS — Z7982 Long term (current) use of aspirin: Secondary | ICD-10-CM | POA: Diagnosis not present

## 2012-04-26 DIAGNOSIS — Z853 Personal history of malignant neoplasm of breast: Secondary | ICD-10-CM | POA: Diagnosis not present

## 2012-04-26 DIAGNOSIS — N61 Mastitis without abscess: Secondary | ICD-10-CM | POA: Diagnosis not present

## 2012-04-26 DIAGNOSIS — Z7982 Long term (current) use of aspirin: Secondary | ICD-10-CM | POA: Diagnosis not present

## 2012-04-26 DIAGNOSIS — I2510 Atherosclerotic heart disease of native coronary artery without angina pectoris: Secondary | ICD-10-CM | POA: Diagnosis not present

## 2012-04-26 DIAGNOSIS — I10 Essential (primary) hypertension: Secondary | ICD-10-CM | POA: Diagnosis not present

## 2012-04-26 DIAGNOSIS — L98499 Non-pressure chronic ulcer of skin of other sites with unspecified severity: Secondary | ICD-10-CM | POA: Diagnosis not present

## 2012-04-26 DIAGNOSIS — T8131XA Disruption of external operation (surgical) wound, not elsewhere classified, initial encounter: Secondary | ICD-10-CM | POA: Diagnosis not present

## 2012-05-02 DIAGNOSIS — B070 Plantar wart: Secondary | ICD-10-CM | POA: Diagnosis not present

## 2012-05-04 DIAGNOSIS — E785 Hyperlipidemia, unspecified: Secondary | ICD-10-CM | POA: Diagnosis not present

## 2012-05-04 DIAGNOSIS — T8131XA Disruption of external operation (surgical) wound, not elsewhere classified, initial encounter: Secondary | ICD-10-CM | POA: Diagnosis not present

## 2012-05-04 DIAGNOSIS — N61 Mastitis without abscess: Secondary | ICD-10-CM | POA: Diagnosis not present

## 2012-05-04 DIAGNOSIS — I2510 Atherosclerotic heart disease of native coronary artery without angina pectoris: Secondary | ICD-10-CM | POA: Diagnosis not present

## 2012-05-04 DIAGNOSIS — G603 Idiopathic progressive neuropathy: Secondary | ICD-10-CM | POA: Diagnosis not present

## 2012-05-04 DIAGNOSIS — F17200 Nicotine dependence, unspecified, uncomplicated: Secondary | ICD-10-CM | POA: Diagnosis not present

## 2012-05-04 DIAGNOSIS — D0590 Unspecified type of carcinoma in situ of unspecified breast: Secondary | ICD-10-CM | POA: Diagnosis not present

## 2012-05-04 DIAGNOSIS — L98499 Non-pressure chronic ulcer of skin of other sites with unspecified severity: Secondary | ICD-10-CM | POA: Diagnosis not present

## 2012-05-04 DIAGNOSIS — I10 Essential (primary) hypertension: Secondary | ICD-10-CM | POA: Diagnosis not present

## 2012-05-04 DIAGNOSIS — I059 Rheumatic mitral valve disease, unspecified: Secondary | ICD-10-CM | POA: Diagnosis not present

## 2012-05-04 DIAGNOSIS — G609 Hereditary and idiopathic neuropathy, unspecified: Secondary | ICD-10-CM | POA: Diagnosis not present

## 2012-05-04 DIAGNOSIS — Z79899 Other long term (current) drug therapy: Secondary | ICD-10-CM | POA: Diagnosis not present

## 2012-05-04 DIAGNOSIS — Z7982 Long term (current) use of aspirin: Secondary | ICD-10-CM | POA: Diagnosis not present

## 2012-05-05 DIAGNOSIS — M899 Disorder of bone, unspecified: Secondary | ICD-10-CM | POA: Diagnosis not present

## 2012-05-05 DIAGNOSIS — M949 Disorder of cartilage, unspecified: Secondary | ICD-10-CM | POA: Diagnosis not present

## 2012-05-05 DIAGNOSIS — C50419 Malignant neoplasm of upper-outer quadrant of unspecified female breast: Secondary | ICD-10-CM | POA: Diagnosis not present

## 2012-05-10 DIAGNOSIS — F331 Major depressive disorder, recurrent, moderate: Secondary | ICD-10-CM | POA: Diagnosis not present

## 2012-05-11 DIAGNOSIS — N61 Mastitis without abscess: Secondary | ICD-10-CM | POA: Diagnosis not present

## 2012-05-11 DIAGNOSIS — Z7982 Long term (current) use of aspirin: Secondary | ICD-10-CM | POA: Diagnosis not present

## 2012-05-11 DIAGNOSIS — I2510 Atherosclerotic heart disease of native coronary artery without angina pectoris: Secondary | ICD-10-CM | POA: Diagnosis not present

## 2012-05-11 DIAGNOSIS — T8131XA Disruption of external operation (surgical) wound, not elsewhere classified, initial encounter: Secondary | ICD-10-CM | POA: Diagnosis not present

## 2012-05-11 DIAGNOSIS — I10 Essential (primary) hypertension: Secondary | ICD-10-CM | POA: Diagnosis not present

## 2012-05-11 DIAGNOSIS — L98499 Non-pressure chronic ulcer of skin of other sites with unspecified severity: Secondary | ICD-10-CM | POA: Diagnosis not present

## 2012-05-18 DIAGNOSIS — N61 Mastitis without abscess: Secondary | ICD-10-CM | POA: Diagnosis not present

## 2012-05-18 DIAGNOSIS — L98499 Non-pressure chronic ulcer of skin of other sites with unspecified severity: Secondary | ICD-10-CM | POA: Diagnosis not present

## 2012-05-18 DIAGNOSIS — Z23 Encounter for immunization: Secondary | ICD-10-CM | POA: Diagnosis not present

## 2012-05-18 DIAGNOSIS — T8131XA Disruption of external operation (surgical) wound, not elsewhere classified, initial encounter: Secondary | ICD-10-CM | POA: Diagnosis not present

## 2012-05-18 DIAGNOSIS — Z7982 Long term (current) use of aspirin: Secondary | ICD-10-CM | POA: Diagnosis not present

## 2012-05-18 DIAGNOSIS — I10 Essential (primary) hypertension: Secondary | ICD-10-CM | POA: Diagnosis not present

## 2012-05-18 DIAGNOSIS — I2510 Atherosclerotic heart disease of native coronary artery without angina pectoris: Secondary | ICD-10-CM | POA: Diagnosis not present

## 2012-06-01 DIAGNOSIS — Z7982 Long term (current) use of aspirin: Secondary | ICD-10-CM | POA: Diagnosis not present

## 2012-06-01 DIAGNOSIS — G603 Idiopathic progressive neuropathy: Secondary | ICD-10-CM | POA: Diagnosis not present

## 2012-06-01 DIAGNOSIS — Z79899 Other long term (current) drug therapy: Secondary | ICD-10-CM | POA: Diagnosis not present

## 2012-06-01 DIAGNOSIS — I10 Essential (primary) hypertension: Secondary | ICD-10-CM | POA: Diagnosis not present

## 2012-06-01 DIAGNOSIS — F17200 Nicotine dependence, unspecified, uncomplicated: Secondary | ICD-10-CM | POA: Diagnosis not present

## 2012-06-01 DIAGNOSIS — G609 Hereditary and idiopathic neuropathy, unspecified: Secondary | ICD-10-CM | POA: Diagnosis not present

## 2012-06-01 DIAGNOSIS — I059 Rheumatic mitral valve disease, unspecified: Secondary | ICD-10-CM | POA: Diagnosis not present

## 2012-06-01 DIAGNOSIS — D0590 Unspecified type of carcinoma in situ of unspecified breast: Secondary | ICD-10-CM | POA: Diagnosis not present

## 2012-06-01 DIAGNOSIS — M7750 Other enthesopathy of unspecified foot: Secondary | ICD-10-CM | POA: Diagnosis not present

## 2012-06-01 DIAGNOSIS — M7989 Other specified soft tissue disorders: Secondary | ICD-10-CM | POA: Diagnosis not present

## 2012-06-01 DIAGNOSIS — B070 Plantar wart: Secondary | ICD-10-CM | POA: Diagnosis not present

## 2012-06-01 DIAGNOSIS — E785 Hyperlipidemia, unspecified: Secondary | ICD-10-CM | POA: Diagnosis not present

## 2012-06-01 DIAGNOSIS — T8131XA Disruption of external operation (surgical) wound, not elsewhere classified, initial encounter: Secondary | ICD-10-CM | POA: Diagnosis not present

## 2012-06-01 DIAGNOSIS — L98499 Non-pressure chronic ulcer of skin of other sites with unspecified severity: Secondary | ICD-10-CM | POA: Diagnosis not present

## 2012-06-01 DIAGNOSIS — N61 Mastitis without abscess: Secondary | ICD-10-CM | POA: Diagnosis not present

## 2012-06-01 DIAGNOSIS — I2510 Atherosclerotic heart disease of native coronary artery without angina pectoris: Secondary | ICD-10-CM | POA: Diagnosis not present

## 2012-06-23 DIAGNOSIS — R229 Localized swelling, mass and lump, unspecified: Secondary | ICD-10-CM | POA: Diagnosis not present

## 2012-06-29 DIAGNOSIS — M7989 Other specified soft tissue disorders: Secondary | ICD-10-CM | POA: Diagnosis not present

## 2012-07-06 DIAGNOSIS — M7989 Other specified soft tissue disorders: Secondary | ICD-10-CM | POA: Diagnosis not present

## 2012-07-06 DIAGNOSIS — D2120 Benign neoplasm of connective and other soft tissue of unspecified lower limb, including hip: Secondary | ICD-10-CM | POA: Diagnosis not present

## 2012-07-08 DIAGNOSIS — Z7982 Long term (current) use of aspirin: Secondary | ICD-10-CM | POA: Diagnosis not present

## 2012-07-08 DIAGNOSIS — Z79899 Other long term (current) drug therapy: Secondary | ICD-10-CM | POA: Diagnosis not present

## 2012-07-08 DIAGNOSIS — D0590 Unspecified type of carcinoma in situ of unspecified breast: Secondary | ICD-10-CM | POA: Diagnosis not present

## 2012-07-08 DIAGNOSIS — F341 Dysthymic disorder: Secondary | ICD-10-CM | POA: Diagnosis not present

## 2012-07-11 DIAGNOSIS — L723 Sebaceous cyst: Secondary | ICD-10-CM | POA: Diagnosis not present

## 2012-07-11 DIAGNOSIS — D2120 Benign neoplasm of connective and other soft tissue of unspecified lower limb, including hip: Secondary | ICD-10-CM | POA: Diagnosis not present

## 2012-07-13 DIAGNOSIS — M899 Disorder of bone, unspecified: Secondary | ICD-10-CM | POA: Diagnosis not present

## 2012-07-13 DIAGNOSIS — Z09 Encounter for follow-up examination after completed treatment for conditions other than malignant neoplasm: Secondary | ICD-10-CM | POA: Diagnosis not present

## 2012-07-13 DIAGNOSIS — Z853 Personal history of malignant neoplasm of breast: Secondary | ICD-10-CM | POA: Diagnosis not present

## 2012-07-13 DIAGNOSIS — C50419 Malignant neoplasm of upper-outer quadrant of unspecified female breast: Secondary | ICD-10-CM | POA: Diagnosis not present

## 2012-07-22 DIAGNOSIS — R9389 Abnormal findings on diagnostic imaging of other specified body structures: Secondary | ICD-10-CM | POA: Diagnosis not present

## 2012-07-22 DIAGNOSIS — C50919 Malignant neoplasm of unspecified site of unspecified female breast: Secondary | ICD-10-CM | POA: Diagnosis not present

## 2012-08-11 DIAGNOSIS — Z7982 Long term (current) use of aspirin: Secondary | ICD-10-CM | POA: Diagnosis not present

## 2012-08-11 DIAGNOSIS — F4310 Post-traumatic stress disorder, unspecified: Secondary | ICD-10-CM | POA: Diagnosis not present

## 2012-08-11 DIAGNOSIS — F329 Major depressive disorder, single episode, unspecified: Secondary | ICD-10-CM | POA: Diagnosis present

## 2012-08-11 DIAGNOSIS — K219 Gastro-esophageal reflux disease without esophagitis: Secondary | ICD-10-CM | POA: Diagnosis present

## 2012-08-11 DIAGNOSIS — R7989 Other specified abnormal findings of blood chemistry: Secondary | ICD-10-CM | POA: Diagnosis not present

## 2012-08-11 DIAGNOSIS — R51 Headache: Secondary | ICD-10-CM | POA: Diagnosis not present

## 2012-08-11 DIAGNOSIS — F17200 Nicotine dependence, unspecified, uncomplicated: Secondary | ICD-10-CM | POA: Diagnosis present

## 2012-08-11 DIAGNOSIS — F489 Nonpsychotic mental disorder, unspecified: Secondary | ICD-10-CM | POA: Diagnosis not present

## 2012-08-11 DIAGNOSIS — F411 Generalized anxiety disorder: Secondary | ICD-10-CM | POA: Diagnosis not present

## 2012-08-11 DIAGNOSIS — Z79899 Other long term (current) drug therapy: Secondary | ICD-10-CM | POA: Diagnosis not present

## 2012-08-11 DIAGNOSIS — E785 Hyperlipidemia, unspecified: Secondary | ICD-10-CM | POA: Diagnosis not present

## 2012-08-11 DIAGNOSIS — I10 Essential (primary) hypertension: Secondary | ICD-10-CM | POA: Diagnosis not present

## 2012-08-11 DIAGNOSIS — R6889 Other general symptoms and signs: Secondary | ICD-10-CM | POA: Diagnosis not present

## 2012-08-11 DIAGNOSIS — G609 Hereditary and idiopathic neuropathy, unspecified: Secondary | ICD-10-CM | POA: Diagnosis not present

## 2012-08-11 DIAGNOSIS — I2510 Atherosclerotic heart disease of native coronary artery without angina pectoris: Secondary | ICD-10-CM | POA: Diagnosis not present

## 2012-08-11 DIAGNOSIS — R112 Nausea with vomiting, unspecified: Secondary | ICD-10-CM | POA: Diagnosis not present

## 2012-08-11 DIAGNOSIS — Z23 Encounter for immunization: Secondary | ICD-10-CM | POA: Diagnosis not present

## 2012-08-11 DIAGNOSIS — R748 Abnormal levels of other serum enzymes: Secondary | ICD-10-CM | POA: Diagnosis not present

## 2012-08-23 IMAGING — CR DG CHEST 2V
2 series · 2 of 2 positions shown · non-contrast
Comparison: None.

CLINICAL DATA: Preoperative admission radiograph.

CHEST - 2 VIEW

[view not recorded (1 of 2)]
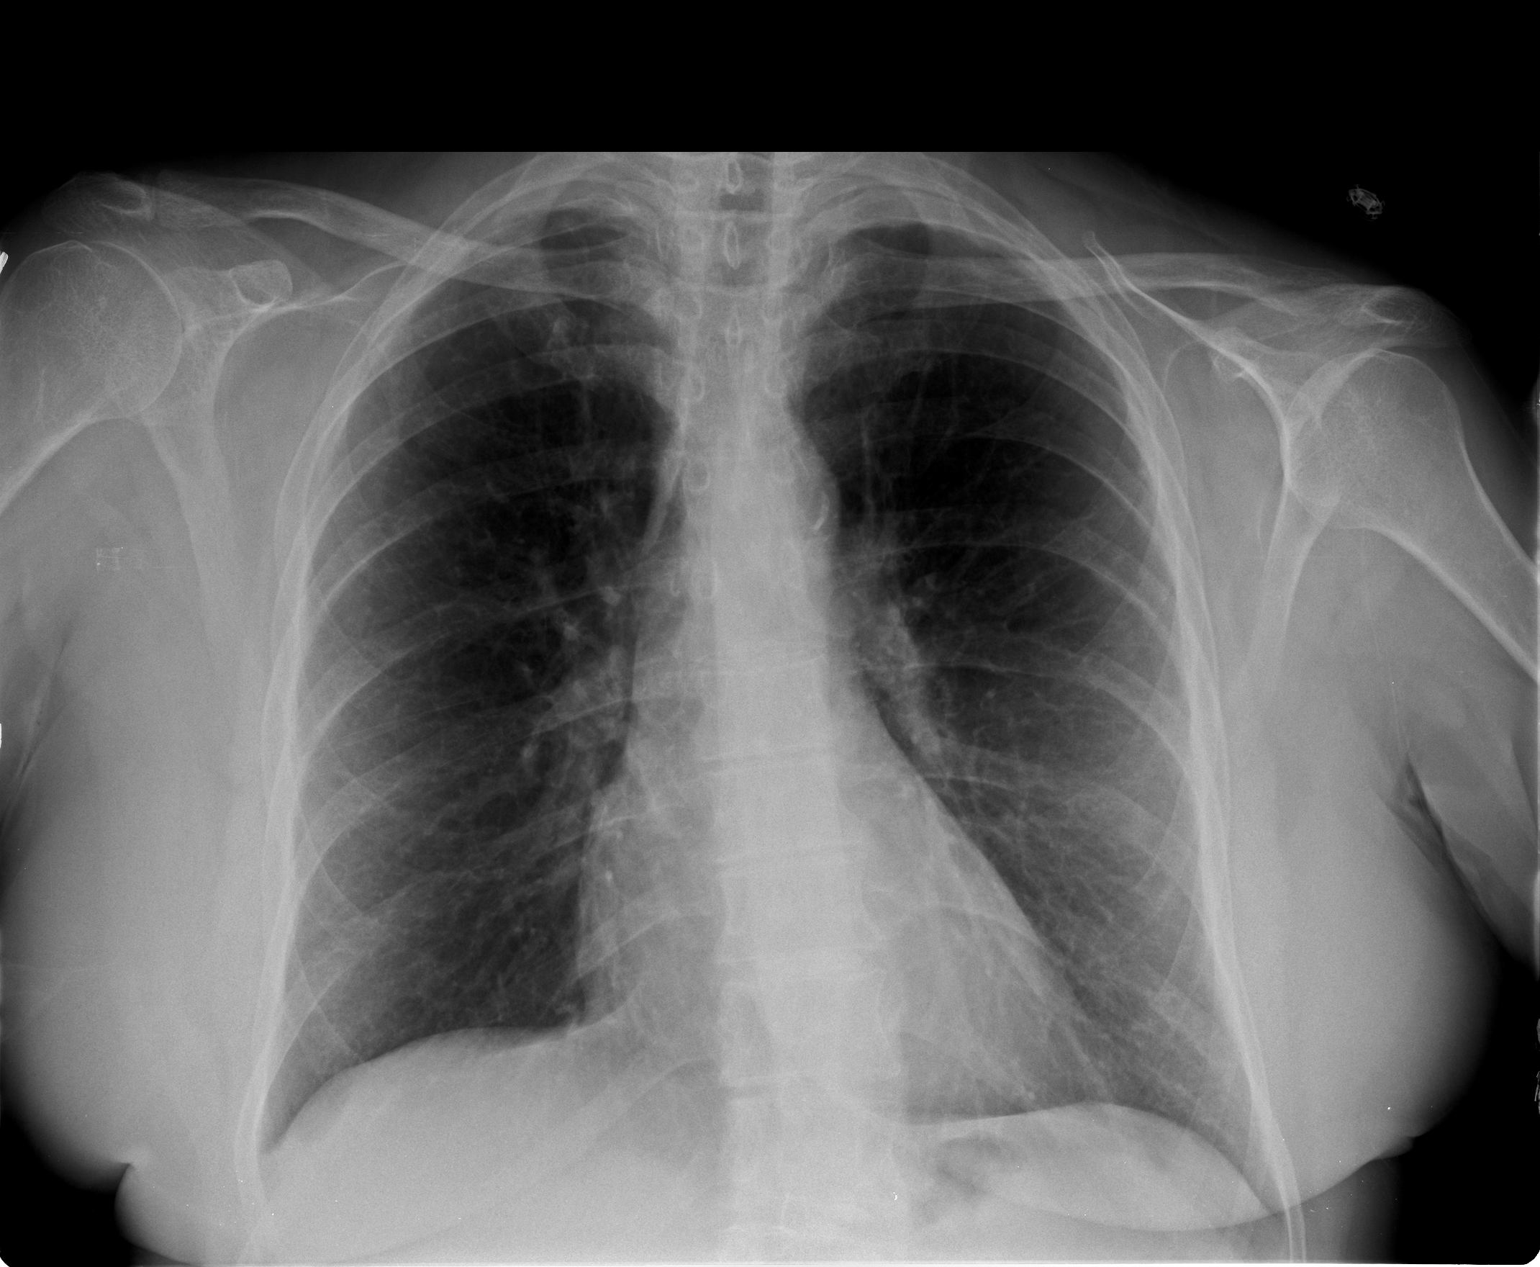

[view not recorded (2 of 2)]
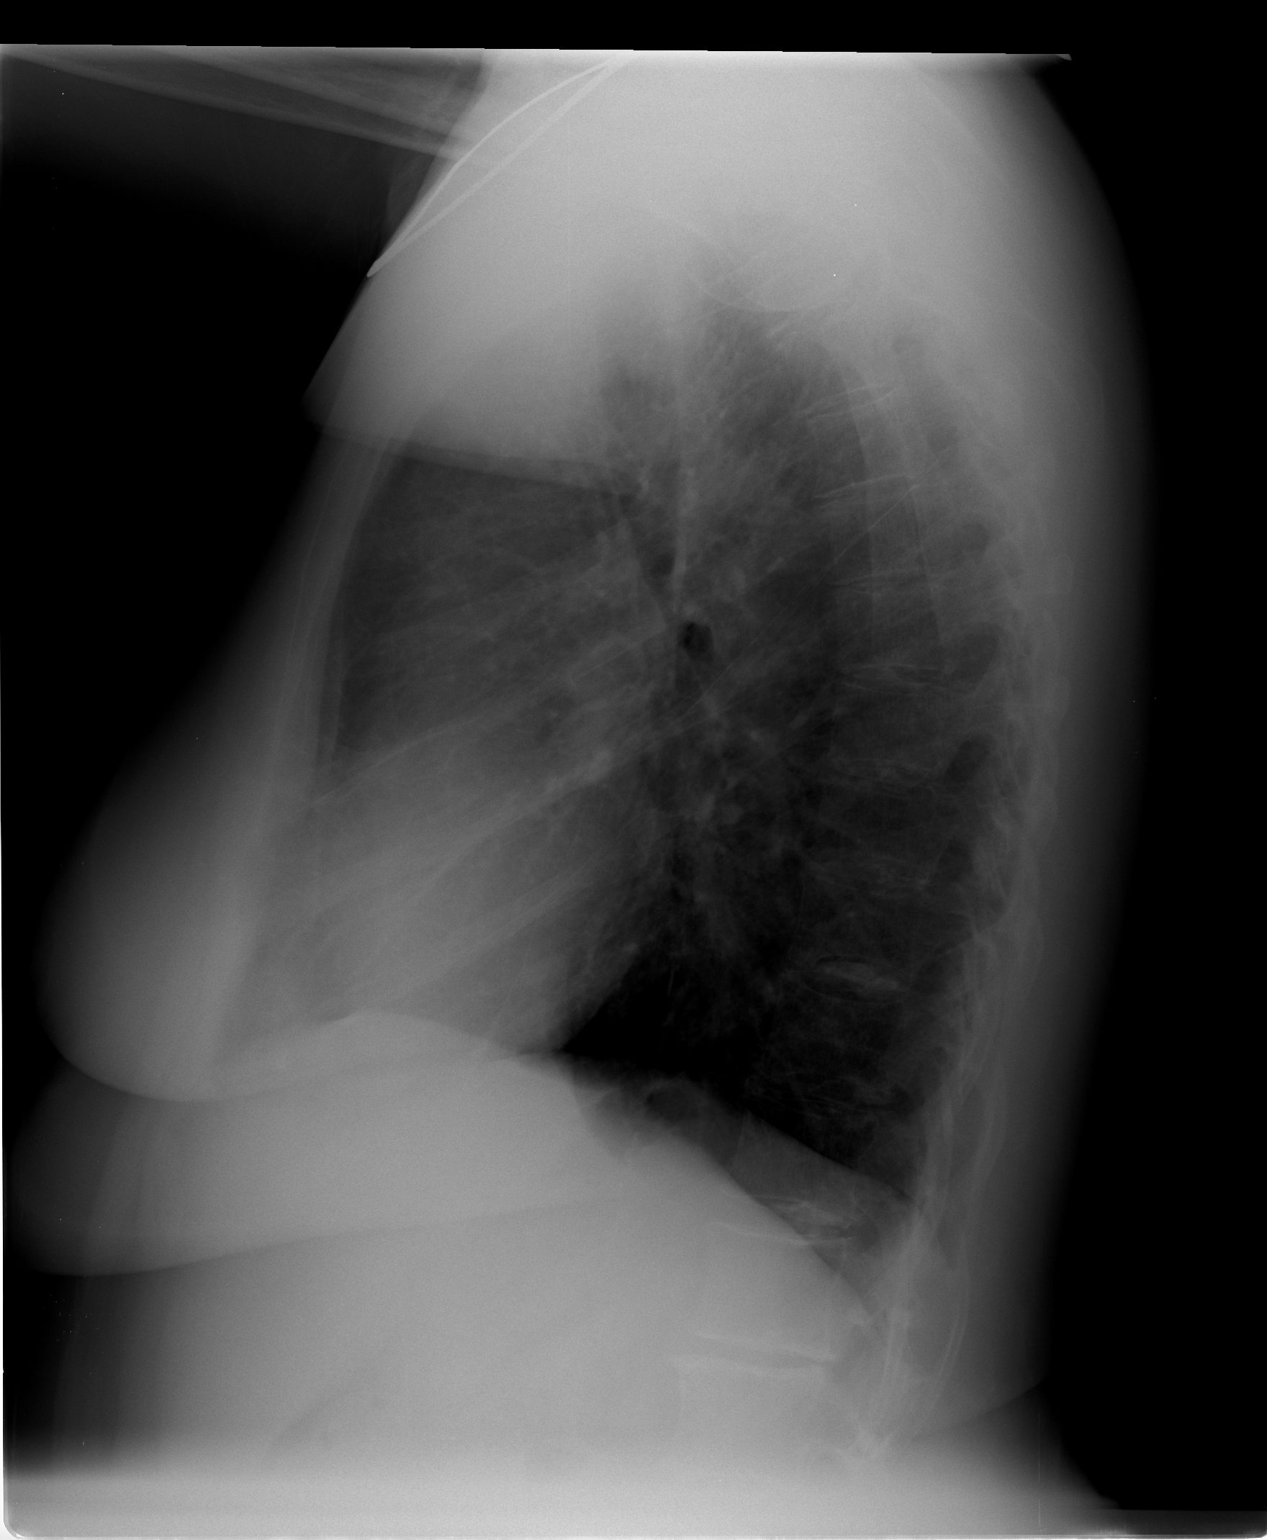

[2 of 2 positions shown; findings below may reference images not displayed]

FINDINGS: Cardiopericardial silhouette is within normal limits.
Bilateral pleural apical scarring is present.  Emphysema.  There is
no airspace disease.  No effusion.  Trachea midline.
IMPRESSION: Emphysema without acute cardiopulmonary disease.

## 2012-08-24 DIAGNOSIS — I2510 Atherosclerotic heart disease of native coronary artery without angina pectoris: Secondary | ICD-10-CM | POA: Diagnosis not present

## 2012-08-24 DIAGNOSIS — F17200 Nicotine dependence, unspecified, uncomplicated: Secondary | ICD-10-CM | POA: Diagnosis not present

## 2012-08-24 DIAGNOSIS — I059 Rheumatic mitral valve disease, unspecified: Secondary | ICD-10-CM | POA: Diagnosis not present

## 2012-08-24 DIAGNOSIS — E785 Hyperlipidemia, unspecified: Secondary | ICD-10-CM | POA: Diagnosis not present

## 2012-09-21 DIAGNOSIS — Z9889 Other specified postprocedural states: Secondary | ICD-10-CM | POA: Diagnosis not present

## 2012-09-21 DIAGNOSIS — C50419 Malignant neoplasm of upper-outer quadrant of unspecified female breast: Secondary | ICD-10-CM | POA: Diagnosis not present

## 2012-10-10 DIAGNOSIS — D2120 Benign neoplasm of connective and other soft tissue of unspecified lower limb, including hip: Secondary | ICD-10-CM | POA: Diagnosis not present

## 2012-10-10 DIAGNOSIS — M7750 Other enthesopathy of unspecified foot: Secondary | ICD-10-CM | POA: Diagnosis not present

## 2012-10-12 DIAGNOSIS — D0590 Unspecified type of carcinoma in situ of unspecified breast: Secondary | ICD-10-CM | POA: Diagnosis not present

## 2012-10-13 DIAGNOSIS — M949 Disorder of cartilage, unspecified: Secondary | ICD-10-CM | POA: Diagnosis not present

## 2012-10-13 DIAGNOSIS — Z09 Encounter for follow-up examination after completed treatment for conditions other than malignant neoplasm: Secondary | ICD-10-CM | POA: Diagnosis not present

## 2012-10-13 DIAGNOSIS — M899 Disorder of bone, unspecified: Secondary | ICD-10-CM | POA: Diagnosis not present

## 2012-10-13 DIAGNOSIS — Z853 Personal history of malignant neoplasm of breast: Secondary | ICD-10-CM | POA: Diagnosis not present

## 2012-11-01 DIAGNOSIS — I10 Essential (primary) hypertension: Secondary | ICD-10-CM | POA: Diagnosis not present

## 2012-11-01 DIAGNOSIS — Z79899 Other long term (current) drug therapy: Secondary | ICD-10-CM | POA: Diagnosis not present

## 2012-11-01 DIAGNOSIS — E559 Vitamin D deficiency, unspecified: Secondary | ICD-10-CM | POA: Diagnosis not present

## 2012-11-01 DIAGNOSIS — J449 Chronic obstructive pulmonary disease, unspecified: Secondary | ICD-10-CM | POA: Diagnosis not present

## 2012-11-01 DIAGNOSIS — J309 Allergic rhinitis, unspecified: Secondary | ICD-10-CM | POA: Diagnosis not present

## 2012-11-01 DIAGNOSIS — E785 Hyperlipidemia, unspecified: Secondary | ICD-10-CM | POA: Diagnosis not present

## 2012-12-01 DIAGNOSIS — D0590 Unspecified type of carcinoma in situ of unspecified breast: Secondary | ICD-10-CM | POA: Diagnosis not present

## 2012-12-01 DIAGNOSIS — J449 Chronic obstructive pulmonary disease, unspecified: Secondary | ICD-10-CM | POA: Diagnosis not present

## 2012-12-01 DIAGNOSIS — J309 Allergic rhinitis, unspecified: Secondary | ICD-10-CM | POA: Diagnosis not present

## 2012-12-01 DIAGNOSIS — I10 Essential (primary) hypertension: Secondary | ICD-10-CM | POA: Diagnosis not present

## 2012-12-07 DIAGNOSIS — I10 Essential (primary) hypertension: Secondary | ICD-10-CM | POA: Diagnosis not present

## 2012-12-07 DIAGNOSIS — E785 Hyperlipidemia, unspecified: Secondary | ICD-10-CM | POA: Diagnosis not present

## 2012-12-07 DIAGNOSIS — F17200 Nicotine dependence, unspecified, uncomplicated: Secondary | ICD-10-CM | POA: Diagnosis not present

## 2012-12-07 DIAGNOSIS — I2510 Atherosclerotic heart disease of native coronary artery without angina pectoris: Secondary | ICD-10-CM | POA: Diagnosis not present

## 2012-12-07 DIAGNOSIS — I059 Rheumatic mitral valve disease, unspecified: Secondary | ICD-10-CM | POA: Diagnosis not present

## 2013-01-18 DIAGNOSIS — Z853 Personal history of malignant neoplasm of breast: Secondary | ICD-10-CM | POA: Diagnosis not present

## 2013-01-18 DIAGNOSIS — M899 Disorder of bone, unspecified: Secondary | ICD-10-CM | POA: Diagnosis not present

## 2013-01-18 DIAGNOSIS — E876 Hypokalemia: Secondary | ICD-10-CM | POA: Diagnosis not present

## 2013-01-18 DIAGNOSIS — Z09 Encounter for follow-up examination after completed treatment for conditions other than malignant neoplasm: Secondary | ICD-10-CM | POA: Diagnosis not present

## 2013-03-02 DIAGNOSIS — Z0000 Encounter for general adult medical examination without abnormal findings: Secondary | ICD-10-CM | POA: Diagnosis not present

## 2013-03-02 DIAGNOSIS — Z1212 Encounter for screening for malignant neoplasm of rectum: Secondary | ICD-10-CM | POA: Diagnosis not present

## 2013-03-02 DIAGNOSIS — I10 Essential (primary) hypertension: Secondary | ICD-10-CM | POA: Diagnosis not present

## 2013-03-02 DIAGNOSIS — Z124 Encounter for screening for malignant neoplasm of cervix: Secondary | ICD-10-CM | POA: Diagnosis not present

## 2013-03-02 DIAGNOSIS — I259 Chronic ischemic heart disease, unspecified: Secondary | ICD-10-CM | POA: Diagnosis not present

## 2013-03-02 DIAGNOSIS — Z23 Encounter for immunization: Secondary | ICD-10-CM | POA: Diagnosis not present

## 2013-04-12 DIAGNOSIS — M545 Low back pain, unspecified: Secondary | ICD-10-CM | POA: Diagnosis not present

## 2013-04-12 DIAGNOSIS — K649 Unspecified hemorrhoids: Secondary | ICD-10-CM | POA: Diagnosis not present

## 2013-04-12 DIAGNOSIS — Z09 Encounter for follow-up examination after completed treatment for conditions other than malignant neoplasm: Secondary | ICD-10-CM | POA: Diagnosis not present

## 2013-04-12 DIAGNOSIS — G8929 Other chronic pain: Secondary | ICD-10-CM | POA: Diagnosis not present

## 2013-04-12 DIAGNOSIS — Z87898 Personal history of other specified conditions: Secondary | ICD-10-CM | POA: Diagnosis not present

## 2013-05-11 DIAGNOSIS — F331 Major depressive disorder, recurrent, moderate: Secondary | ICD-10-CM | POA: Diagnosis not present

## 2013-06-06 DIAGNOSIS — F341 Dysthymic disorder: Secondary | ICD-10-CM | POA: Diagnosis not present

## 2013-06-06 DIAGNOSIS — E785 Hyperlipidemia, unspecified: Secondary | ICD-10-CM | POA: Diagnosis not present

## 2013-06-06 DIAGNOSIS — J449 Chronic obstructive pulmonary disease, unspecified: Secondary | ICD-10-CM | POA: Diagnosis not present

## 2013-06-06 DIAGNOSIS — Z8659 Personal history of other mental and behavioral disorders: Secondary | ICD-10-CM | POA: Diagnosis not present

## 2013-06-07 DIAGNOSIS — R7301 Impaired fasting glucose: Secondary | ICD-10-CM | POA: Diagnosis not present

## 2013-06-07 DIAGNOSIS — Z79899 Other long term (current) drug therapy: Secondary | ICD-10-CM | POA: Diagnosis not present

## 2013-06-07 DIAGNOSIS — E559 Vitamin D deficiency, unspecified: Secondary | ICD-10-CM | POA: Diagnosis not present

## 2013-06-07 DIAGNOSIS — E785 Hyperlipidemia, unspecified: Secondary | ICD-10-CM | POA: Diagnosis not present

## 2013-06-29 DIAGNOSIS — F17200 Nicotine dependence, unspecified, uncomplicated: Secondary | ICD-10-CM | POA: Diagnosis present

## 2013-06-29 DIAGNOSIS — R0609 Other forms of dyspnea: Secondary | ICD-10-CM | POA: Diagnosis not present

## 2013-06-29 DIAGNOSIS — G609 Hereditary and idiopathic neuropathy, unspecified: Secondary | ICD-10-CM | POA: Diagnosis present

## 2013-06-29 DIAGNOSIS — F329 Major depressive disorder, single episode, unspecified: Secondary | ICD-10-CM | POA: Diagnosis not present

## 2013-06-29 DIAGNOSIS — Z7982 Long term (current) use of aspirin: Secondary | ICD-10-CM | POA: Diagnosis not present

## 2013-06-29 DIAGNOSIS — R0602 Shortness of breath: Secondary | ICD-10-CM | POA: Diagnosis not present

## 2013-06-29 DIAGNOSIS — R079 Chest pain, unspecified: Secondary | ICD-10-CM | POA: Diagnosis not present

## 2013-06-29 DIAGNOSIS — I10 Essential (primary) hypertension: Secondary | ICD-10-CM | POA: Diagnosis present

## 2013-06-29 DIAGNOSIS — I214 Non-ST elevation (NSTEMI) myocardial infarction: Secondary | ICD-10-CM | POA: Diagnosis not present

## 2013-06-29 DIAGNOSIS — D72829 Elevated white blood cell count, unspecified: Secondary | ICD-10-CM | POA: Diagnosis not present

## 2013-06-29 DIAGNOSIS — R072 Precordial pain: Secondary | ICD-10-CM | POA: Diagnosis not present

## 2013-06-29 DIAGNOSIS — I2510 Atherosclerotic heart disease of native coronary artery without angina pectoris: Secondary | ICD-10-CM | POA: Diagnosis not present

## 2013-06-29 DIAGNOSIS — K219 Gastro-esophageal reflux disease without esophagitis: Secondary | ICD-10-CM | POA: Diagnosis not present

## 2013-06-29 DIAGNOSIS — I252 Old myocardial infarction: Secondary | ICD-10-CM | POA: Diagnosis not present

## 2013-06-29 DIAGNOSIS — R0989 Other specified symptoms and signs involving the circulatory and respiratory systems: Secondary | ICD-10-CM | POA: Diagnosis not present

## 2013-06-29 DIAGNOSIS — E785 Hyperlipidemia, unspecified: Secondary | ICD-10-CM | POA: Diagnosis present

## 2013-06-29 DIAGNOSIS — R9431 Abnormal electrocardiogram [ECG] [EKG]: Secondary | ICD-10-CM | POA: Diagnosis not present

## 2013-06-29 DIAGNOSIS — I509 Heart failure, unspecified: Secondary | ICD-10-CM | POA: Diagnosis not present

## 2013-06-29 DIAGNOSIS — M129 Arthropathy, unspecified: Secondary | ICD-10-CM | POA: Diagnosis present

## 2013-06-29 DIAGNOSIS — J449 Chronic obstructive pulmonary disease, unspecified: Secondary | ICD-10-CM | POA: Diagnosis not present

## 2013-06-29 DIAGNOSIS — R0902 Hypoxemia: Secondary | ICD-10-CM | POA: Diagnosis not present

## 2013-06-29 DIAGNOSIS — Z87898 Personal history of other specified conditions: Secondary | ICD-10-CM | POA: Diagnosis not present

## 2013-06-29 DIAGNOSIS — Z8541 Personal history of malignant neoplasm of cervix uteri: Secondary | ICD-10-CM | POA: Diagnosis not present

## 2013-06-29 DIAGNOSIS — F3289 Other specified depressive episodes: Secondary | ICD-10-CM | POA: Diagnosis not present

## 2013-06-29 DIAGNOSIS — M899 Disorder of bone, unspecified: Secondary | ICD-10-CM | POA: Diagnosis present

## 2013-06-29 DIAGNOSIS — J4489 Other specified chronic obstructive pulmonary disease: Secondary | ICD-10-CM | POA: Diagnosis present

## 2013-06-29 DIAGNOSIS — Z79899 Other long term (current) drug therapy: Secondary | ICD-10-CM | POA: Diagnosis not present

## 2013-06-30 DIAGNOSIS — I2510 Atherosclerotic heart disease of native coronary artery without angina pectoris: Secondary | ICD-10-CM | POA: Diagnosis not present

## 2013-06-30 DIAGNOSIS — Z79899 Other long term (current) drug therapy: Secondary | ICD-10-CM | POA: Diagnosis not present

## 2013-06-30 DIAGNOSIS — F3289 Other specified depressive episodes: Secondary | ICD-10-CM | POA: Diagnosis not present

## 2013-06-30 DIAGNOSIS — I10 Essential (primary) hypertension: Secondary | ICD-10-CM | POA: Diagnosis not present

## 2013-06-30 DIAGNOSIS — J449 Chronic obstructive pulmonary disease, unspecified: Secondary | ICD-10-CM | POA: Diagnosis not present

## 2013-06-30 DIAGNOSIS — R079 Chest pain, unspecified: Secondary | ICD-10-CM | POA: Diagnosis not present

## 2013-06-30 DIAGNOSIS — F329 Major depressive disorder, single episode, unspecified: Secondary | ICD-10-CM | POA: Diagnosis not present

## 2013-06-30 DIAGNOSIS — R072 Precordial pain: Secondary | ICD-10-CM | POA: Diagnosis not present

## 2013-07-01 DIAGNOSIS — I10 Essential (primary) hypertension: Secondary | ICD-10-CM | POA: Diagnosis not present

## 2013-07-01 DIAGNOSIS — I4581 Long QT syndrome: Secondary | ICD-10-CM | POA: Diagnosis not present

## 2013-07-01 DIAGNOSIS — F329 Major depressive disorder, single episode, unspecified: Secondary | ICD-10-CM | POA: Diagnosis not present

## 2013-07-01 DIAGNOSIS — F3289 Other specified depressive episodes: Secondary | ICD-10-CM | POA: Diagnosis not present

## 2013-07-01 DIAGNOSIS — R072 Precordial pain: Secondary | ICD-10-CM | POA: Diagnosis not present

## 2013-07-01 DIAGNOSIS — J449 Chronic obstructive pulmonary disease, unspecified: Secondary | ICD-10-CM | POA: Diagnosis not present

## 2013-07-01 DIAGNOSIS — I2510 Atherosclerotic heart disease of native coronary artery without angina pectoris: Secondary | ICD-10-CM | POA: Diagnosis not present

## 2013-07-01 DIAGNOSIS — R079 Chest pain, unspecified: Secondary | ICD-10-CM | POA: Diagnosis not present

## 2013-07-01 DIAGNOSIS — Z79899 Other long term (current) drug therapy: Secondary | ICD-10-CM | POA: Diagnosis not present

## 2013-07-13 DIAGNOSIS — F17200 Nicotine dependence, unspecified, uncomplicated: Secondary | ICD-10-CM | POA: Diagnosis not present

## 2013-07-13 DIAGNOSIS — E785 Hyperlipidemia, unspecified: Secondary | ICD-10-CM | POA: Diagnosis not present

## 2013-07-13 DIAGNOSIS — I10 Essential (primary) hypertension: Secondary | ICD-10-CM | POA: Diagnosis not present

## 2013-07-13 DIAGNOSIS — I2510 Atherosclerotic heart disease of native coronary artery without angina pectoris: Secondary | ICD-10-CM | POA: Diagnosis not present

## 2013-07-13 DIAGNOSIS — F411 Generalized anxiety disorder: Secondary | ICD-10-CM | POA: Diagnosis not present

## 2013-09-25 DIAGNOSIS — Z09 Encounter for follow-up examination after completed treatment for conditions other than malignant neoplasm: Secondary | ICD-10-CM | POA: Diagnosis not present

## 2013-09-25 DIAGNOSIS — M899 Disorder of bone, unspecified: Secondary | ICD-10-CM | POA: Diagnosis not present

## 2013-09-25 DIAGNOSIS — Z853 Personal history of malignant neoplasm of breast: Secondary | ICD-10-CM | POA: Diagnosis not present

## 2013-09-25 DIAGNOSIS — M949 Disorder of cartilage, unspecified: Secondary | ICD-10-CM | POA: Diagnosis not present

## 2013-09-28 DIAGNOSIS — C50419 Malignant neoplasm of upper-outer quadrant of unspecified female breast: Secondary | ICD-10-CM | POA: Diagnosis not present

## 2013-09-28 DIAGNOSIS — R922 Inconclusive mammogram: Secondary | ICD-10-CM | POA: Diagnosis not present

## 2013-09-28 DIAGNOSIS — Z9889 Other specified postprocedural states: Secondary | ICD-10-CM | POA: Diagnosis not present

## 2013-10-30 DIAGNOSIS — G43909 Migraine, unspecified, not intractable, without status migrainosus: Secondary | ICD-10-CM | POA: Diagnosis not present

## 2013-10-30 DIAGNOSIS — K219 Gastro-esophageal reflux disease without esophagitis: Secondary | ICD-10-CM | POA: Diagnosis not present

## 2013-10-30 DIAGNOSIS — I10 Essential (primary) hypertension: Secondary | ICD-10-CM | POA: Diagnosis not present

## 2013-10-30 DIAGNOSIS — M1990 Unspecified osteoarthritis, unspecified site: Secondary | ICD-10-CM | POA: Diagnosis not present

## 2013-11-02 DIAGNOSIS — K589 Irritable bowel syndrome without diarrhea: Secondary | ICD-10-CM | POA: Diagnosis not present

## 2013-11-02 DIAGNOSIS — K921 Melena: Secondary | ICD-10-CM | POA: Diagnosis not present

## 2013-11-02 DIAGNOSIS — K219 Gastro-esophageal reflux disease without esophagitis: Secondary | ICD-10-CM | POA: Diagnosis not present

## 2013-11-22 DIAGNOSIS — I10 Essential (primary) hypertension: Secondary | ICD-10-CM | POA: Diagnosis not present

## 2013-11-22 DIAGNOSIS — E785 Hyperlipidemia, unspecified: Secondary | ICD-10-CM | POA: Diagnosis not present

## 2013-11-22 DIAGNOSIS — E7800 Pure hypercholesterolemia, unspecified: Secondary | ICD-10-CM | POA: Diagnosis not present

## 2013-11-22 DIAGNOSIS — I059 Rheumatic mitral valve disease, unspecified: Secondary | ICD-10-CM | POA: Diagnosis not present

## 2014-01-03 DIAGNOSIS — K921 Melena: Secondary | ICD-10-CM | POA: Diagnosis not present

## 2014-01-30 DIAGNOSIS — R7309 Other abnormal glucose: Secondary | ICD-10-CM | POA: Diagnosis not present

## 2014-01-30 DIAGNOSIS — Z23 Encounter for immunization: Secondary | ICD-10-CM | POA: Diagnosis not present

## 2014-01-30 DIAGNOSIS — R413 Other amnesia: Secondary | ICD-10-CM | POA: Diagnosis not present

## 2014-01-30 DIAGNOSIS — Z79899 Other long term (current) drug therapy: Secondary | ICD-10-CM | POA: Diagnosis not present

## 2014-01-30 DIAGNOSIS — E785 Hyperlipidemia, unspecified: Secondary | ICD-10-CM | POA: Diagnosis not present

## 2014-01-30 DIAGNOSIS — M1990 Unspecified osteoarthritis, unspecified site: Secondary | ICD-10-CM | POA: Diagnosis not present

## 2014-01-30 DIAGNOSIS — E559 Vitamin D deficiency, unspecified: Secondary | ICD-10-CM | POA: Diagnosis not present

## 2014-02-09 DIAGNOSIS — C50412 Malignant neoplasm of upper-outer quadrant of left female breast: Secondary | ICD-10-CM | POA: Diagnosis not present

## 2014-04-02 DIAGNOSIS — C50412 Malignant neoplasm of upper-outer quadrant of left female breast: Secondary | ICD-10-CM | POA: Diagnosis not present

## 2014-04-02 DIAGNOSIS — R922 Inconclusive mammogram: Secondary | ICD-10-CM | POA: Diagnosis not present

## 2014-05-08 DIAGNOSIS — Z6826 Body mass index (BMI) 26.0-26.9, adult: Secondary | ICD-10-CM | POA: Diagnosis not present

## 2014-05-08 DIAGNOSIS — E785 Hyperlipidemia, unspecified: Secondary | ICD-10-CM | POA: Diagnosis not present

## 2014-05-08 DIAGNOSIS — J440 Chronic obstructive pulmonary disease with acute lower respiratory infection: Secondary | ICD-10-CM | POA: Diagnosis not present

## 2014-05-08 DIAGNOSIS — J449 Chronic obstructive pulmonary disease, unspecified: Secondary | ICD-10-CM | POA: Diagnosis not present

## 2014-05-08 DIAGNOSIS — H68009 Unspecified Eustachian salpingitis, unspecified ear: Secondary | ICD-10-CM | POA: Diagnosis not present

## 2014-05-08 DIAGNOSIS — J309 Allergic rhinitis, unspecified: Secondary | ICD-10-CM | POA: Diagnosis not present

## 2014-05-08 DIAGNOSIS — I10 Essential (primary) hypertension: Secondary | ICD-10-CM | POA: Diagnosis not present

## 2014-06-05 DIAGNOSIS — I10 Essential (primary) hypertension: Secondary | ICD-10-CM | POA: Diagnosis not present

## 2014-06-05 DIAGNOSIS — E780 Pure hypercholesterolemia: Secondary | ICD-10-CM | POA: Diagnosis not present

## 2014-06-05 DIAGNOSIS — E785 Hyperlipidemia, unspecified: Secondary | ICD-10-CM | POA: Diagnosis not present

## 2014-06-05 DIAGNOSIS — I2510 Atherosclerotic heart disease of native coronary artery without angina pectoris: Secondary | ICD-10-CM | POA: Diagnosis not present

## 2014-06-06 DIAGNOSIS — F331 Major depressive disorder, recurrent, moderate: Secondary | ICD-10-CM | POA: Diagnosis not present

## 2014-06-27 DIAGNOSIS — M79606 Pain in leg, unspecified: Secondary | ICD-10-CM | POA: Diagnosis not present

## 2014-07-12 DIAGNOSIS — M25551 Pain in right hip: Secondary | ICD-10-CM | POA: Diagnosis not present

## 2014-07-12 DIAGNOSIS — M25561 Pain in right knee: Secondary | ICD-10-CM | POA: Diagnosis not present

## 2014-08-08 DIAGNOSIS — Z853 Personal history of malignant neoplasm of breast: Secondary | ICD-10-CM | POA: Diagnosis not present

## 2014-08-08 DIAGNOSIS — R102 Pelvic and perineal pain: Secondary | ICD-10-CM | POA: Diagnosis not present

## 2014-08-16 DIAGNOSIS — N3281 Overactive bladder: Secondary | ICD-10-CM | POA: Diagnosis not present

## 2014-08-16 DIAGNOSIS — W19XXXA Unspecified fall, initial encounter: Secondary | ICD-10-CM | POA: Diagnosis not present

## 2014-08-16 DIAGNOSIS — R928 Other abnormal and inconclusive findings on diagnostic imaging of breast: Secondary | ICD-10-CM | POA: Diagnosis not present

## 2014-08-16 DIAGNOSIS — Z9181 History of falling: Secondary | ICD-10-CM | POA: Diagnosis not present

## 2014-08-16 DIAGNOSIS — J309 Allergic rhinitis, unspecified: Secondary | ICD-10-CM | POA: Diagnosis not present

## 2014-08-16 DIAGNOSIS — Z1389 Encounter for screening for other disorder: Secondary | ICD-10-CM | POA: Diagnosis not present

## 2014-08-16 DIAGNOSIS — Z6826 Body mass index (BMI) 26.0-26.9, adult: Secondary | ICD-10-CM | POA: Diagnosis not present

## 2014-08-16 DIAGNOSIS — Z13820 Encounter for screening for osteoporosis: Secondary | ICD-10-CM | POA: Diagnosis not present

## 2014-08-21 DIAGNOSIS — E559 Vitamin D deficiency, unspecified: Secondary | ICD-10-CM | POA: Diagnosis not present

## 2014-08-21 DIAGNOSIS — Z79899 Other long term (current) drug therapy: Secondary | ICD-10-CM | POA: Diagnosis not present

## 2014-08-21 DIAGNOSIS — E785 Hyperlipidemia, unspecified: Secondary | ICD-10-CM | POA: Diagnosis not present

## 2014-08-23 DIAGNOSIS — Z0000 Encounter for general adult medical examination without abnormal findings: Secondary | ICD-10-CM | POA: Diagnosis not present

## 2014-08-23 DIAGNOSIS — Z1212 Encounter for screening for malignant neoplasm of rectum: Secondary | ICD-10-CM | POA: Diagnosis not present

## 2014-08-23 DIAGNOSIS — H9113 Presbycusis, bilateral: Secondary | ICD-10-CM | POA: Diagnosis not present

## 2014-08-23 DIAGNOSIS — Z6826 Body mass index (BMI) 26.0-26.9, adult: Secondary | ICD-10-CM | POA: Diagnosis not present

## 2014-08-23 DIAGNOSIS — Z23 Encounter for immunization: Secondary | ICD-10-CM | POA: Diagnosis not present

## 2014-08-23 DIAGNOSIS — I10 Essential (primary) hypertension: Secondary | ICD-10-CM | POA: Diagnosis not present

## 2014-08-23 DIAGNOSIS — D0512 Intraductal carcinoma in situ of left breast: Secondary | ICD-10-CM | POA: Diagnosis not present

## 2014-08-23 DIAGNOSIS — M8588 Other specified disorders of bone density and structure, other site: Secondary | ICD-10-CM | POA: Diagnosis not present

## 2014-09-06 DIAGNOSIS — I83029 Varicose veins of left lower extremity with ulcer of unspecified site: Secondary | ICD-10-CM | POA: Diagnosis not present

## 2014-09-06 DIAGNOSIS — A047 Enterocolitis due to Clostridium difficile: Secondary | ICD-10-CM | POA: Diagnosis not present

## 2014-09-07 DIAGNOSIS — I83029 Varicose veins of left lower extremity with ulcer of unspecified site: Secondary | ICD-10-CM | POA: Diagnosis not present

## 2014-09-07 DIAGNOSIS — A047 Enterocolitis due to Clostridium difficile: Secondary | ICD-10-CM | POA: Diagnosis not present

## 2014-09-10 DIAGNOSIS — A047 Enterocolitis due to Clostridium difficile: Secondary | ICD-10-CM | POA: Diagnosis not present

## 2014-09-10 DIAGNOSIS — I83029 Varicose veins of left lower extremity with ulcer of unspecified site: Secondary | ICD-10-CM | POA: Diagnosis not present

## 2014-09-11 DIAGNOSIS — I83029 Varicose veins of left lower extremity with ulcer of unspecified site: Secondary | ICD-10-CM | POA: Diagnosis not present

## 2014-09-11 DIAGNOSIS — A047 Enterocolitis due to Clostridium difficile: Secondary | ICD-10-CM | POA: Diagnosis not present

## 2014-09-12 DIAGNOSIS — I83029 Varicose veins of left lower extremity with ulcer of unspecified site: Secondary | ICD-10-CM | POA: Diagnosis not present

## 2014-09-12 DIAGNOSIS — A047 Enterocolitis due to Clostridium difficile: Secondary | ICD-10-CM | POA: Diagnosis not present

## 2014-09-13 DIAGNOSIS — I83029 Varicose veins of left lower extremity with ulcer of unspecified site: Secondary | ICD-10-CM | POA: Diagnosis not present

## 2014-09-13 DIAGNOSIS — A047 Enterocolitis due to Clostridium difficile: Secondary | ICD-10-CM | POA: Diagnosis not present

## 2014-09-14 DIAGNOSIS — I83029 Varicose veins of left lower extremity with ulcer of unspecified site: Secondary | ICD-10-CM | POA: Diagnosis not present

## 2014-09-14 DIAGNOSIS — J189 Pneumonia, unspecified organism: Secondary | ICD-10-CM | POA: Diagnosis not present

## 2014-09-14 DIAGNOSIS — I10 Essential (primary) hypertension: Secondary | ICD-10-CM | POA: Diagnosis not present

## 2014-09-14 DIAGNOSIS — R918 Other nonspecific abnormal finding of lung field: Secondary | ICD-10-CM | POA: Diagnosis not present

## 2014-09-14 DIAGNOSIS — R51 Headache: Secondary | ICD-10-CM | POA: Diagnosis not present

## 2014-09-14 DIAGNOSIS — A047 Enterocolitis due to Clostridium difficile: Secondary | ICD-10-CM | POA: Diagnosis not present

## 2014-09-14 DIAGNOSIS — G43909 Migraine, unspecified, not intractable, without status migrainosus: Secondary | ICD-10-CM | POA: Diagnosis not present

## 2014-09-15 DIAGNOSIS — A047 Enterocolitis due to Clostridium difficile: Secondary | ICD-10-CM | POA: Diagnosis not present

## 2014-09-15 DIAGNOSIS — I83029 Varicose veins of left lower extremity with ulcer of unspecified site: Secondary | ICD-10-CM | POA: Diagnosis not present

## 2014-09-16 DIAGNOSIS — I83029 Varicose veins of left lower extremity with ulcer of unspecified site: Secondary | ICD-10-CM | POA: Diagnosis not present

## 2014-09-16 DIAGNOSIS — A047 Enterocolitis due to Clostridium difficile: Secondary | ICD-10-CM | POA: Diagnosis not present

## 2014-09-17 DIAGNOSIS — I83029 Varicose veins of left lower extremity with ulcer of unspecified site: Secondary | ICD-10-CM | POA: Diagnosis not present

## 2014-09-17 DIAGNOSIS — A047 Enterocolitis due to Clostridium difficile: Secondary | ICD-10-CM | POA: Diagnosis not present

## 2014-09-18 DIAGNOSIS — I674 Hypertensive encephalopathy: Secondary | ICD-10-CM | POA: Diagnosis not present

## 2014-09-18 DIAGNOSIS — R51 Headache: Secondary | ICD-10-CM | POA: Diagnosis not present

## 2014-09-18 DIAGNOSIS — R079 Chest pain, unspecified: Secondary | ICD-10-CM | POA: Diagnosis not present

## 2014-09-19 DIAGNOSIS — J449 Chronic obstructive pulmonary disease, unspecified: Secondary | ICD-10-CM | POA: Diagnosis not present

## 2014-09-19 DIAGNOSIS — Z8249 Family history of ischemic heart disease and other diseases of the circulatory system: Secondary | ICD-10-CM | POA: Diagnosis not present

## 2014-09-19 DIAGNOSIS — F4310 Post-traumatic stress disorder, unspecified: Secondary | ICD-10-CM | POA: Diagnosis present

## 2014-09-19 DIAGNOSIS — R079 Chest pain, unspecified: Secondary | ICD-10-CM | POA: Diagnosis not present

## 2014-09-19 DIAGNOSIS — M1990 Unspecified osteoarthritis, unspecified site: Secondary | ICD-10-CM | POA: Diagnosis present

## 2014-09-19 DIAGNOSIS — Z91030 Bee allergy status: Secondary | ICD-10-CM | POA: Diagnosis not present

## 2014-09-19 DIAGNOSIS — I6523 Occlusion and stenosis of bilateral carotid arteries: Secondary | ICD-10-CM | POA: Diagnosis not present

## 2014-09-19 DIAGNOSIS — I252 Old myocardial infarction: Secondary | ICD-10-CM | POA: Diagnosis not present

## 2014-09-19 DIAGNOSIS — R269 Unspecified abnormalities of gait and mobility: Secondary | ICD-10-CM | POA: Diagnosis not present

## 2014-09-19 DIAGNOSIS — I10 Essential (primary) hypertension: Secondary | ICD-10-CM | POA: Diagnosis not present

## 2014-09-19 DIAGNOSIS — Z853 Personal history of malignant neoplasm of breast: Secondary | ICD-10-CM | POA: Diagnosis not present

## 2014-09-19 DIAGNOSIS — K219 Gastro-esophageal reflux disease without esophagitis: Secondary | ICD-10-CM | POA: Diagnosis present

## 2014-09-19 DIAGNOSIS — E780 Pure hypercholesterolemia: Secondary | ICD-10-CM | POA: Diagnosis present

## 2014-09-19 DIAGNOSIS — J189 Pneumonia, unspecified organism: Secondary | ICD-10-CM | POA: Diagnosis present

## 2014-09-19 DIAGNOSIS — R42 Dizziness and giddiness: Secondary | ICD-10-CM | POA: Diagnosis not present

## 2014-09-19 DIAGNOSIS — G43909 Migraine, unspecified, not intractable, without status migrainosus: Secondary | ICD-10-CM | POA: Diagnosis present

## 2014-09-19 DIAGNOSIS — F0390 Unspecified dementia without behavioral disturbance: Secondary | ICD-10-CM | POA: Diagnosis present

## 2014-09-19 DIAGNOSIS — Z91040 Latex allergy status: Secondary | ICD-10-CM | POA: Diagnosis not present

## 2014-09-19 DIAGNOSIS — F419 Anxiety disorder, unspecified: Secondary | ICD-10-CM | POA: Diagnosis present

## 2014-09-19 DIAGNOSIS — Z90721 Acquired absence of ovaries, unilateral: Secondary | ICD-10-CM | POA: Diagnosis present

## 2014-09-19 DIAGNOSIS — I674 Hypertensive encephalopathy: Secondary | ICD-10-CM | POA: Diagnosis not present

## 2014-09-19 DIAGNOSIS — Z7982 Long term (current) use of aspirin: Secondary | ICD-10-CM | POA: Diagnosis not present

## 2014-09-19 DIAGNOSIS — Z87440 Personal history of urinary (tract) infections: Secondary | ICD-10-CM | POA: Diagnosis not present

## 2014-09-19 DIAGNOSIS — G459 Transient cerebral ischemic attack, unspecified: Secondary | ICD-10-CM | POA: Diagnosis present

## 2014-09-19 DIAGNOSIS — F329 Major depressive disorder, single episode, unspecified: Secondary | ICD-10-CM | POA: Diagnosis present

## 2014-09-19 DIAGNOSIS — R55 Syncope and collapse: Secondary | ICD-10-CM | POA: Diagnosis not present

## 2014-09-19 DIAGNOSIS — Z720 Tobacco use: Secondary | ICD-10-CM | POA: Diagnosis not present

## 2014-09-19 DIAGNOSIS — R51 Headache: Secondary | ICD-10-CM | POA: Diagnosis not present

## 2014-09-19 DIAGNOSIS — Z90710 Acquired absence of both cervix and uterus: Secondary | ICD-10-CM | POA: Diagnosis not present

## 2014-09-21 DIAGNOSIS — T447X5A Adverse effect of beta-adrenoreceptor antagonists, initial encounter: Secondary | ICD-10-CM | POA: Diagnosis present

## 2014-09-21 DIAGNOSIS — T404X5A Adverse effect of other synthetic narcotics, initial encounter: Secondary | ICD-10-CM | POA: Diagnosis present

## 2014-09-21 DIAGNOSIS — F4310 Post-traumatic stress disorder, unspecified: Secondary | ICD-10-CM | POA: Diagnosis present

## 2014-09-21 DIAGNOSIS — R509 Fever, unspecified: Secondary | ICD-10-CM | POA: Diagnosis not present

## 2014-09-21 DIAGNOSIS — Z90710 Acquired absence of both cervix and uterus: Secondary | ICD-10-CM | POA: Diagnosis not present

## 2014-09-21 DIAGNOSIS — R4182 Altered mental status, unspecified: Secondary | ICD-10-CM | POA: Diagnosis not present

## 2014-09-21 DIAGNOSIS — E780 Pure hypercholesterolemia: Secondary | ICD-10-CM | POA: Diagnosis present

## 2014-09-21 DIAGNOSIS — J449 Chronic obstructive pulmonary disease, unspecified: Secondary | ICD-10-CM | POA: Diagnosis not present

## 2014-09-21 DIAGNOSIS — K219 Gastro-esophageal reflux disease without esophagitis: Secondary | ICD-10-CM | POA: Diagnosis present

## 2014-09-21 DIAGNOSIS — M1990 Unspecified osteoarthritis, unspecified site: Secondary | ICD-10-CM | POA: Diagnosis present

## 2014-09-21 DIAGNOSIS — T39395A Adverse effect of other nonsteroidal anti-inflammatory drugs [NSAID], initial encounter: Secondary | ICD-10-CM | POA: Diagnosis present

## 2014-09-21 DIAGNOSIS — Z720 Tobacco use: Secondary | ICD-10-CM | POA: Diagnosis not present

## 2014-09-21 DIAGNOSIS — Z90722 Acquired absence of ovaries, bilateral: Secondary | ICD-10-CM | POA: Diagnosis present

## 2014-09-21 DIAGNOSIS — G43909 Migraine, unspecified, not intractable, without status migrainosus: Secondary | ICD-10-CM | POA: Diagnosis not present

## 2014-09-21 DIAGNOSIS — I674 Hypertensive encephalopathy: Secondary | ICD-10-CM | POA: Diagnosis not present

## 2014-09-21 DIAGNOSIS — K8020 Calculus of gallbladder without cholecystitis without obstruction: Secondary | ICD-10-CM | POA: Diagnosis not present

## 2014-09-21 DIAGNOSIS — T450X5A Adverse effect of antiallergic and antiemetic drugs, initial encounter: Secondary | ICD-10-CM | POA: Diagnosis present

## 2014-09-21 DIAGNOSIS — R112 Nausea with vomiting, unspecified: Secondary | ICD-10-CM | POA: Diagnosis not present

## 2014-09-21 DIAGNOSIS — I2510 Atherosclerotic heart disease of native coronary artery without angina pectoris: Secondary | ICD-10-CM | POA: Diagnosis present

## 2014-09-21 DIAGNOSIS — F0280 Dementia in other diseases classified elsewhere without behavioral disturbance: Secondary | ICD-10-CM | POA: Diagnosis present

## 2014-09-21 DIAGNOSIS — G309 Alzheimer's disease, unspecified: Secondary | ICD-10-CM | POA: Diagnosis present

## 2014-09-21 DIAGNOSIS — R51 Headache: Secondary | ICD-10-CM | POA: Diagnosis not present

## 2014-09-21 DIAGNOSIS — T43595A Adverse effect of other antipsychotics and neuroleptics, initial encounter: Secondary | ICD-10-CM | POA: Diagnosis present

## 2014-09-21 DIAGNOSIS — Z87440 Personal history of urinary (tract) infections: Secondary | ICD-10-CM | POA: Diagnosis not present

## 2014-09-21 DIAGNOSIS — I10 Essential (primary) hypertension: Secondary | ICD-10-CM | POA: Diagnosis not present

## 2014-09-21 DIAGNOSIS — T380X5A Adverse effect of glucocorticoids and synthetic analogues, initial encounter: Secondary | ICD-10-CM | POA: Diagnosis present

## 2014-09-21 DIAGNOSIS — F418 Other specified anxiety disorders: Secondary | ICD-10-CM | POA: Diagnosis present

## 2014-09-21 DIAGNOSIS — Z716 Tobacco abuse counseling: Secondary | ICD-10-CM | POA: Diagnosis not present

## 2014-09-21 DIAGNOSIS — Z853 Personal history of malignant neoplasm of breast: Secondary | ICD-10-CM | POA: Diagnosis not present

## 2014-09-21 DIAGNOSIS — R079 Chest pain, unspecified: Secondary | ICD-10-CM | POA: Diagnosis not present

## 2014-09-21 DIAGNOSIS — I252 Old myocardial infarction: Secondary | ICD-10-CM | POA: Diagnosis not present

## 2014-09-21 DIAGNOSIS — G2579 Other drug induced movement disorders: Secondary | ICD-10-CM | POA: Diagnosis not present

## 2014-09-25 DIAGNOSIS — I6523 Occlusion and stenosis of bilateral carotid arteries: Secondary | ICD-10-CM | POA: Diagnosis not present

## 2014-09-25 DIAGNOSIS — I639 Cerebral infarction, unspecified: Secondary | ICD-10-CM | POA: Diagnosis not present

## 2014-09-25 DIAGNOSIS — M6281 Muscle weakness (generalized): Secondary | ICD-10-CM | POA: Diagnosis not present

## 2014-09-25 DIAGNOSIS — I6789 Other cerebrovascular disease: Secondary | ICD-10-CM | POA: Diagnosis not present

## 2014-09-25 DIAGNOSIS — R200 Anesthesia of skin: Secondary | ICD-10-CM | POA: Diagnosis not present

## 2014-09-26 DIAGNOSIS — Z981 Arthrodesis status: Secondary | ICD-10-CM | POA: Diagnosis not present

## 2014-09-26 DIAGNOSIS — M47892 Other spondylosis, cervical region: Secondary | ICD-10-CM | POA: Diagnosis not present

## 2014-09-26 DIAGNOSIS — M5032 Other cervical disc degeneration, mid-cervical region: Secondary | ICD-10-CM | POA: Diagnosis not present

## 2014-09-27 DIAGNOSIS — I639 Cerebral infarction, unspecified: Secondary | ICD-10-CM | POA: Diagnosis not present

## 2014-09-27 DIAGNOSIS — R51 Headache: Secondary | ICD-10-CM | POA: Diagnosis not present

## 2014-09-27 DIAGNOSIS — R531 Weakness: Secondary | ICD-10-CM | POA: Diagnosis not present

## 2014-09-28 DIAGNOSIS — I2510 Atherosclerotic heart disease of native coronary artery without angina pectoris: Secondary | ICD-10-CM | POA: Diagnosis not present

## 2014-09-28 DIAGNOSIS — R51 Headache: Secondary | ICD-10-CM | POA: Diagnosis not present

## 2014-09-28 DIAGNOSIS — I63541 Cerebral infarction due to unspecified occlusion or stenosis of right cerebellar artery: Secondary | ICD-10-CM | POA: Diagnosis not present

## 2014-09-28 DIAGNOSIS — J449 Chronic obstructive pulmonary disease, unspecified: Secondary | ICD-10-CM | POA: Diagnosis not present

## 2014-09-28 DIAGNOSIS — I639 Cerebral infarction, unspecified: Secondary | ICD-10-CM | POA: Diagnosis not present

## 2014-09-28 DIAGNOSIS — R531 Weakness: Secondary | ICD-10-CM | POA: Diagnosis not present

## 2014-10-01 DIAGNOSIS — M545 Low back pain: Secondary | ICD-10-CM | POA: Diagnosis not present

## 2014-10-01 DIAGNOSIS — E785 Hyperlipidemia, unspecified: Secondary | ICD-10-CM | POA: Diagnosis not present

## 2014-10-01 DIAGNOSIS — M6281 Muscle weakness (generalized): Secondary | ICD-10-CM | POA: Diagnosis not present

## 2014-10-01 DIAGNOSIS — I10 Essential (primary) hypertension: Secondary | ICD-10-CM | POA: Diagnosis not present

## 2014-10-01 DIAGNOSIS — K219 Gastro-esophageal reflux disease without esophagitis: Secondary | ICD-10-CM | POA: Diagnosis not present

## 2014-10-01 DIAGNOSIS — M542 Cervicalgia: Secondary | ICD-10-CM | POA: Diagnosis not present

## 2014-10-01 DIAGNOSIS — H547 Unspecified visual loss: Secondary | ICD-10-CM | POA: Diagnosis not present

## 2014-10-01 DIAGNOSIS — Z79899 Other long term (current) drug therapy: Secondary | ICD-10-CM | POA: Diagnosis not present

## 2014-10-01 DIAGNOSIS — M19071 Primary osteoarthritis, right ankle and foot: Secondary | ICD-10-CM | POA: Diagnosis not present

## 2014-10-01 DIAGNOSIS — M5030 Other cervical disc degeneration, unspecified cervical region: Secondary | ICD-10-CM | POA: Diagnosis not present

## 2014-10-01 DIAGNOSIS — R278 Other lack of coordination: Secondary | ICD-10-CM | POA: Diagnosis not present

## 2014-10-01 DIAGNOSIS — F332 Major depressive disorder, recurrent severe without psychotic features: Secondary | ICD-10-CM | POA: Diagnosis not present

## 2014-10-01 DIAGNOSIS — I6789 Other cerebrovascular disease: Secondary | ICD-10-CM | POA: Diagnosis not present

## 2014-10-01 DIAGNOSIS — Z853 Personal history of malignant neoplasm of breast: Secondary | ICD-10-CM | POA: Diagnosis not present

## 2014-10-01 DIAGNOSIS — R262 Difficulty in walking, not elsewhere classified: Secondary | ICD-10-CM | POA: Diagnosis not present

## 2014-10-01 DIAGNOSIS — I69352 Hemiplegia and hemiparesis following cerebral infarction affecting left dominant side: Secondary | ICD-10-CM | POA: Diagnosis not present

## 2014-10-01 DIAGNOSIS — F419 Anxiety disorder, unspecified: Secondary | ICD-10-CM | POA: Diagnosis not present

## 2014-10-01 DIAGNOSIS — M79671 Pain in right foot: Secondary | ICD-10-CM | POA: Diagnosis not present

## 2014-10-01 DIAGNOSIS — F4310 Post-traumatic stress disorder, unspecified: Secondary | ICD-10-CM | POA: Diagnosis not present

## 2014-10-01 DIAGNOSIS — I119 Hypertensive heart disease without heart failure: Secondary | ICD-10-CM | POA: Diagnosis not present

## 2014-10-01 DIAGNOSIS — J449 Chronic obstructive pulmonary disease, unspecified: Secondary | ICD-10-CM | POA: Diagnosis not present

## 2014-10-01 DIAGNOSIS — Z09 Encounter for follow-up examination after completed treatment for conditions other than malignant neoplasm: Secondary | ICD-10-CM | POA: Diagnosis not present

## 2014-10-01 DIAGNOSIS — R261 Paralytic gait: Secondary | ICD-10-CM | POA: Diagnosis not present

## 2014-10-01 DIAGNOSIS — M47812 Spondylosis without myelopathy or radiculopathy, cervical region: Secondary | ICD-10-CM | POA: Diagnosis not present

## 2014-10-01 DIAGNOSIS — M1990 Unspecified osteoarthritis, unspecified site: Secondary | ICD-10-CM | POA: Diagnosis not present

## 2014-10-01 DIAGNOSIS — F418 Other specified anxiety disorders: Secondary | ICD-10-CM | POA: Diagnosis not present

## 2014-10-01 DIAGNOSIS — R488 Other symbolic dysfunctions: Secondary | ICD-10-CM | POA: Diagnosis not present

## 2014-10-01 DIAGNOSIS — Z6825 Body mass index (BMI) 25.0-25.9, adult: Secondary | ICD-10-CM | POA: Diagnosis not present

## 2014-10-01 DIAGNOSIS — G92 Toxic encephalopathy: Secondary | ICD-10-CM | POA: Diagnosis not present

## 2014-10-01 DIAGNOSIS — D649 Anemia, unspecified: Secondary | ICD-10-CM | POA: Diagnosis not present

## 2014-10-01 DIAGNOSIS — F329 Major depressive disorder, single episode, unspecified: Secondary | ICD-10-CM | POA: Diagnosis not present

## 2014-10-01 DIAGNOSIS — F39 Unspecified mood [affective] disorder: Secondary | ICD-10-CM | POA: Diagnosis not present

## 2014-10-01 DIAGNOSIS — I69854 Hemiplegia and hemiparesis following other cerebrovascular disease affecting left non-dominant side: Secondary | ICD-10-CM | POA: Diagnosis not present

## 2014-10-01 DIAGNOSIS — R8290 Unspecified abnormal findings in urine: Secondary | ICD-10-CM | POA: Diagnosis not present

## 2014-10-01 DIAGNOSIS — I639 Cerebral infarction, unspecified: Secondary | ICD-10-CM | POA: Diagnosis not present

## 2014-10-01 DIAGNOSIS — I252 Old myocardial infarction: Secondary | ICD-10-CM | POA: Diagnosis not present

## 2014-10-04 DIAGNOSIS — I119 Hypertensive heart disease without heart failure: Secondary | ICD-10-CM | POA: Diagnosis not present

## 2014-10-04 DIAGNOSIS — E785 Hyperlipidemia, unspecified: Secondary | ICD-10-CM | POA: Diagnosis not present

## 2014-10-04 DIAGNOSIS — I639 Cerebral infarction, unspecified: Secondary | ICD-10-CM | POA: Diagnosis not present

## 2014-10-04 DIAGNOSIS — R262 Difficulty in walking, not elsewhere classified: Secondary | ICD-10-CM | POA: Diagnosis not present

## 2014-11-06 DIAGNOSIS — M542 Cervicalgia: Secondary | ICD-10-CM | POA: Diagnosis not present

## 2014-11-06 DIAGNOSIS — I69352 Hemiplegia and hemiparesis following cerebral infarction affecting left dominant side: Secondary | ICD-10-CM | POA: Diagnosis not present

## 2014-11-09 DIAGNOSIS — R8290 Unspecified abnormal findings in urine: Secondary | ICD-10-CM | POA: Diagnosis not present

## 2014-11-22 DIAGNOSIS — J449 Chronic obstructive pulmonary disease, unspecified: Secondary | ICD-10-CM | POA: Diagnosis not present

## 2014-11-22 DIAGNOSIS — I10 Essential (primary) hypertension: Secondary | ICD-10-CM | POA: Diagnosis not present

## 2014-11-22 DIAGNOSIS — H547 Unspecified visual loss: Secondary | ICD-10-CM | POA: Diagnosis not present

## 2014-11-22 DIAGNOSIS — M47812 Spondylosis without myelopathy or radiculopathy, cervical region: Secondary | ICD-10-CM | POA: Diagnosis not present

## 2014-11-22 DIAGNOSIS — F418 Other specified anxiety disorders: Secondary | ICD-10-CM | POA: Diagnosis not present

## 2014-11-22 DIAGNOSIS — Z09 Encounter for follow-up examination after completed treatment for conditions other than malignant neoplasm: Secondary | ICD-10-CM | POA: Diagnosis not present

## 2014-11-22 DIAGNOSIS — I639 Cerebral infarction, unspecified: Secondary | ICD-10-CM | POA: Diagnosis not present

## 2014-11-22 DIAGNOSIS — Z79899 Other long term (current) drug therapy: Secondary | ICD-10-CM | POA: Diagnosis not present

## 2014-11-22 DIAGNOSIS — Z6825 Body mass index (BMI) 25.0-25.9, adult: Secondary | ICD-10-CM | POA: Diagnosis not present

## 2014-11-22 DIAGNOSIS — D649 Anemia, unspecified: Secondary | ICD-10-CM | POA: Diagnosis not present

## 2014-11-26 DIAGNOSIS — I10 Essential (primary) hypertension: Secondary | ICD-10-CM | POA: Diagnosis not present

## 2014-11-26 DIAGNOSIS — R261 Paralytic gait: Secondary | ICD-10-CM | POA: Diagnosis not present

## 2014-11-26 DIAGNOSIS — F419 Anxiety disorder, unspecified: Secondary | ICD-10-CM | POA: Diagnosis not present

## 2014-11-26 DIAGNOSIS — J449 Chronic obstructive pulmonary disease, unspecified: Secondary | ICD-10-CM | POA: Diagnosis not present

## 2014-11-26 DIAGNOSIS — F4310 Post-traumatic stress disorder, unspecified: Secondary | ICD-10-CM | POA: Diagnosis not present

## 2014-11-26 DIAGNOSIS — I69854 Hemiplegia and hemiparesis following other cerebrovascular disease affecting left non-dominant side: Secondary | ICD-10-CM | POA: Diagnosis not present

## 2014-11-27 DIAGNOSIS — F4310 Post-traumatic stress disorder, unspecified: Secondary | ICD-10-CM | POA: Diagnosis not present

## 2014-11-27 DIAGNOSIS — I69854 Hemiplegia and hemiparesis following other cerebrovascular disease affecting left non-dominant side: Secondary | ICD-10-CM | POA: Diagnosis not present

## 2014-11-27 DIAGNOSIS — F419 Anxiety disorder, unspecified: Secondary | ICD-10-CM | POA: Diagnosis not present

## 2014-11-27 DIAGNOSIS — I10 Essential (primary) hypertension: Secondary | ICD-10-CM | POA: Diagnosis not present

## 2014-11-27 DIAGNOSIS — R261 Paralytic gait: Secondary | ICD-10-CM | POA: Diagnosis not present

## 2014-11-27 DIAGNOSIS — J449 Chronic obstructive pulmonary disease, unspecified: Secondary | ICD-10-CM | POA: Diagnosis not present

## 2014-11-29 DIAGNOSIS — F419 Anxiety disorder, unspecified: Secondary | ICD-10-CM | POA: Diagnosis not present

## 2014-11-29 DIAGNOSIS — I69854 Hemiplegia and hemiparesis following other cerebrovascular disease affecting left non-dominant side: Secondary | ICD-10-CM | POA: Diagnosis not present

## 2014-11-29 DIAGNOSIS — F4310 Post-traumatic stress disorder, unspecified: Secondary | ICD-10-CM | POA: Diagnosis not present

## 2014-11-29 DIAGNOSIS — I10 Essential (primary) hypertension: Secondary | ICD-10-CM | POA: Diagnosis not present

## 2014-11-29 DIAGNOSIS — J449 Chronic obstructive pulmonary disease, unspecified: Secondary | ICD-10-CM | POA: Diagnosis not present

## 2014-11-29 DIAGNOSIS — R261 Paralytic gait: Secondary | ICD-10-CM | POA: Diagnosis not present

## 2014-11-30 DIAGNOSIS — R261 Paralytic gait: Secondary | ICD-10-CM | POA: Diagnosis not present

## 2014-11-30 DIAGNOSIS — I10 Essential (primary) hypertension: Secondary | ICD-10-CM | POA: Diagnosis not present

## 2014-11-30 DIAGNOSIS — F4310 Post-traumatic stress disorder, unspecified: Secondary | ICD-10-CM | POA: Diagnosis not present

## 2014-11-30 DIAGNOSIS — J449 Chronic obstructive pulmonary disease, unspecified: Secondary | ICD-10-CM | POA: Diagnosis not present

## 2014-11-30 DIAGNOSIS — F419 Anxiety disorder, unspecified: Secondary | ICD-10-CM | POA: Diagnosis not present

## 2014-11-30 DIAGNOSIS — I69854 Hemiplegia and hemiparesis following other cerebrovascular disease affecting left non-dominant side: Secondary | ICD-10-CM | POA: Diagnosis not present

## 2014-12-03 DIAGNOSIS — F419 Anxiety disorder, unspecified: Secondary | ICD-10-CM | POA: Diagnosis not present

## 2014-12-03 DIAGNOSIS — J449 Chronic obstructive pulmonary disease, unspecified: Secondary | ICD-10-CM | POA: Diagnosis not present

## 2014-12-03 DIAGNOSIS — I10 Essential (primary) hypertension: Secondary | ICD-10-CM | POA: Diagnosis not present

## 2014-12-03 DIAGNOSIS — R261 Paralytic gait: Secondary | ICD-10-CM | POA: Diagnosis not present

## 2014-12-03 DIAGNOSIS — I69854 Hemiplegia and hemiparesis following other cerebrovascular disease affecting left non-dominant side: Secondary | ICD-10-CM | POA: Diagnosis not present

## 2014-12-03 DIAGNOSIS — F4310 Post-traumatic stress disorder, unspecified: Secondary | ICD-10-CM | POA: Diagnosis not present

## 2014-12-05 DIAGNOSIS — R261 Paralytic gait: Secondary | ICD-10-CM | POA: Diagnosis not present

## 2014-12-05 DIAGNOSIS — J449 Chronic obstructive pulmonary disease, unspecified: Secondary | ICD-10-CM | POA: Diagnosis not present

## 2014-12-05 DIAGNOSIS — I69854 Hemiplegia and hemiparesis following other cerebrovascular disease affecting left non-dominant side: Secondary | ICD-10-CM | POA: Diagnosis not present

## 2014-12-05 DIAGNOSIS — F419 Anxiety disorder, unspecified: Secondary | ICD-10-CM | POA: Diagnosis not present

## 2014-12-05 DIAGNOSIS — F4310 Post-traumatic stress disorder, unspecified: Secondary | ICD-10-CM | POA: Diagnosis not present

## 2014-12-05 DIAGNOSIS — I10 Essential (primary) hypertension: Secondary | ICD-10-CM | POA: Diagnosis not present

## 2014-12-06 DIAGNOSIS — Z6825 Body mass index (BMI) 25.0-25.9, adult: Secondary | ICD-10-CM | POA: Diagnosis not present

## 2014-12-06 DIAGNOSIS — R400 Somnolence: Secondary | ICD-10-CM | POA: Diagnosis not present

## 2014-12-07 DIAGNOSIS — R261 Paralytic gait: Secondary | ICD-10-CM | POA: Diagnosis not present

## 2014-12-07 DIAGNOSIS — F419 Anxiety disorder, unspecified: Secondary | ICD-10-CM | POA: Diagnosis not present

## 2014-12-07 DIAGNOSIS — I10 Essential (primary) hypertension: Secondary | ICD-10-CM | POA: Diagnosis not present

## 2014-12-07 DIAGNOSIS — J449 Chronic obstructive pulmonary disease, unspecified: Secondary | ICD-10-CM | POA: Diagnosis not present

## 2014-12-07 DIAGNOSIS — I69854 Hemiplegia and hemiparesis following other cerebrovascular disease affecting left non-dominant side: Secondary | ICD-10-CM | POA: Diagnosis not present

## 2014-12-07 DIAGNOSIS — F4310 Post-traumatic stress disorder, unspecified: Secondary | ICD-10-CM | POA: Diagnosis not present

## 2014-12-10 DIAGNOSIS — R261 Paralytic gait: Secondary | ICD-10-CM | POA: Diagnosis not present

## 2014-12-10 DIAGNOSIS — I69854 Hemiplegia and hemiparesis following other cerebrovascular disease affecting left non-dominant side: Secondary | ICD-10-CM | POA: Diagnosis not present

## 2014-12-10 DIAGNOSIS — J449 Chronic obstructive pulmonary disease, unspecified: Secondary | ICD-10-CM | POA: Diagnosis not present

## 2014-12-10 DIAGNOSIS — I10 Essential (primary) hypertension: Secondary | ICD-10-CM | POA: Diagnosis not present

## 2014-12-10 DIAGNOSIS — F419 Anxiety disorder, unspecified: Secondary | ICD-10-CM | POA: Diagnosis not present

## 2014-12-10 DIAGNOSIS — F4310 Post-traumatic stress disorder, unspecified: Secondary | ICD-10-CM | POA: Diagnosis not present

## 2014-12-12 DIAGNOSIS — R261 Paralytic gait: Secondary | ICD-10-CM | POA: Diagnosis not present

## 2014-12-12 DIAGNOSIS — I69854 Hemiplegia and hemiparesis following other cerebrovascular disease affecting left non-dominant side: Secondary | ICD-10-CM | POA: Diagnosis not present

## 2014-12-12 DIAGNOSIS — F419 Anxiety disorder, unspecified: Secondary | ICD-10-CM | POA: Diagnosis not present

## 2014-12-12 DIAGNOSIS — I10 Essential (primary) hypertension: Secondary | ICD-10-CM | POA: Diagnosis not present

## 2014-12-12 DIAGNOSIS — F4310 Post-traumatic stress disorder, unspecified: Secondary | ICD-10-CM | POA: Diagnosis not present

## 2014-12-12 DIAGNOSIS — J449 Chronic obstructive pulmonary disease, unspecified: Secondary | ICD-10-CM | POA: Diagnosis not present

## 2014-12-14 DIAGNOSIS — I10 Essential (primary) hypertension: Secondary | ICD-10-CM | POA: Diagnosis not present

## 2014-12-14 DIAGNOSIS — R261 Paralytic gait: Secondary | ICD-10-CM | POA: Diagnosis not present

## 2014-12-14 DIAGNOSIS — F419 Anxiety disorder, unspecified: Secondary | ICD-10-CM | POA: Diagnosis not present

## 2014-12-14 DIAGNOSIS — J449 Chronic obstructive pulmonary disease, unspecified: Secondary | ICD-10-CM | POA: Diagnosis not present

## 2014-12-14 DIAGNOSIS — I69854 Hemiplegia and hemiparesis following other cerebrovascular disease affecting left non-dominant side: Secondary | ICD-10-CM | POA: Diagnosis not present

## 2014-12-14 DIAGNOSIS — F4310 Post-traumatic stress disorder, unspecified: Secondary | ICD-10-CM | POA: Diagnosis not present

## 2014-12-17 DIAGNOSIS — F419 Anxiety disorder, unspecified: Secondary | ICD-10-CM | POA: Diagnosis not present

## 2014-12-17 DIAGNOSIS — I10 Essential (primary) hypertension: Secondary | ICD-10-CM | POA: Diagnosis not present

## 2014-12-17 DIAGNOSIS — I69854 Hemiplegia and hemiparesis following other cerebrovascular disease affecting left non-dominant side: Secondary | ICD-10-CM | POA: Diagnosis not present

## 2014-12-17 DIAGNOSIS — R261 Paralytic gait: Secondary | ICD-10-CM | POA: Diagnosis not present

## 2014-12-17 DIAGNOSIS — F4310 Post-traumatic stress disorder, unspecified: Secondary | ICD-10-CM | POA: Diagnosis not present

## 2014-12-17 DIAGNOSIS — J449 Chronic obstructive pulmonary disease, unspecified: Secondary | ICD-10-CM | POA: Diagnosis not present

## 2014-12-19 DIAGNOSIS — I10 Essential (primary) hypertension: Secondary | ICD-10-CM | POA: Diagnosis not present

## 2014-12-19 DIAGNOSIS — R261 Paralytic gait: Secondary | ICD-10-CM | POA: Diagnosis not present

## 2014-12-19 DIAGNOSIS — F419 Anxiety disorder, unspecified: Secondary | ICD-10-CM | POA: Diagnosis not present

## 2014-12-19 DIAGNOSIS — F4310 Post-traumatic stress disorder, unspecified: Secondary | ICD-10-CM | POA: Diagnosis not present

## 2014-12-19 DIAGNOSIS — J449 Chronic obstructive pulmonary disease, unspecified: Secondary | ICD-10-CM | POA: Diagnosis not present

## 2014-12-19 DIAGNOSIS — I69854 Hemiplegia and hemiparesis following other cerebrovascular disease affecting left non-dominant side: Secondary | ICD-10-CM | POA: Diagnosis not present

## 2014-12-21 DIAGNOSIS — F4310 Post-traumatic stress disorder, unspecified: Secondary | ICD-10-CM | POA: Diagnosis not present

## 2014-12-21 DIAGNOSIS — I10 Essential (primary) hypertension: Secondary | ICD-10-CM | POA: Diagnosis not present

## 2014-12-21 DIAGNOSIS — J449 Chronic obstructive pulmonary disease, unspecified: Secondary | ICD-10-CM | POA: Diagnosis not present

## 2014-12-21 DIAGNOSIS — R261 Paralytic gait: Secondary | ICD-10-CM | POA: Diagnosis not present

## 2014-12-21 DIAGNOSIS — I69854 Hemiplegia and hemiparesis following other cerebrovascular disease affecting left non-dominant side: Secondary | ICD-10-CM | POA: Diagnosis not present

## 2014-12-21 DIAGNOSIS — F419 Anxiety disorder, unspecified: Secondary | ICD-10-CM | POA: Diagnosis not present

## 2014-12-23 DIAGNOSIS — E780 Pure hypercholesterolemia: Secondary | ICD-10-CM | POA: Diagnosis not present

## 2014-12-23 DIAGNOSIS — S299XXA Unspecified injury of thorax, initial encounter: Secondary | ICD-10-CM | POA: Diagnosis not present

## 2014-12-23 DIAGNOSIS — S0990XA Unspecified injury of head, initial encounter: Secondary | ICD-10-CM | POA: Diagnosis not present

## 2014-12-23 DIAGNOSIS — I69954 Hemiplegia and hemiparesis following unspecified cerebrovascular disease affecting left non-dominant side: Secondary | ICD-10-CM | POA: Diagnosis not present

## 2014-12-23 DIAGNOSIS — S199XXA Unspecified injury of neck, initial encounter: Secondary | ICD-10-CM | POA: Diagnosis not present

## 2014-12-23 DIAGNOSIS — Z043 Encounter for examination and observation following other accident: Secondary | ICD-10-CM | POA: Diagnosis not present

## 2014-12-23 DIAGNOSIS — I517 Cardiomegaly: Secondary | ICD-10-CM | POA: Diagnosis not present

## 2014-12-23 DIAGNOSIS — S3992XA Unspecified injury of lower back, initial encounter: Secondary | ICD-10-CM | POA: Diagnosis not present

## 2014-12-23 DIAGNOSIS — G92 Toxic encephalopathy: Secondary | ICD-10-CM | POA: Diagnosis not present

## 2014-12-23 DIAGNOSIS — E860 Dehydration: Secondary | ICD-10-CM | POA: Diagnosis not present

## 2014-12-23 DIAGNOSIS — R404 Transient alteration of awareness: Secondary | ICD-10-CM | POA: Diagnosis not present

## 2014-12-23 DIAGNOSIS — J449 Chronic obstructive pulmonary disease, unspecified: Secondary | ICD-10-CM | POA: Diagnosis not present

## 2014-12-23 DIAGNOSIS — Z79899 Other long term (current) drug therapy: Secondary | ICD-10-CM | POA: Diagnosis not present

## 2014-12-23 DIAGNOSIS — N390 Urinary tract infection, site not specified: Secondary | ICD-10-CM | POA: Diagnosis not present

## 2014-12-23 DIAGNOSIS — R4182 Altered mental status, unspecified: Secondary | ICD-10-CM | POA: Diagnosis not present

## 2014-12-23 DIAGNOSIS — K219 Gastro-esophageal reflux disease without esophagitis: Secondary | ICD-10-CM | POA: Diagnosis not present

## 2014-12-23 DIAGNOSIS — I252 Old myocardial infarction: Secondary | ICD-10-CM | POA: Diagnosis not present

## 2014-12-23 DIAGNOSIS — R42 Dizziness and giddiness: Secondary | ICD-10-CM | POA: Diagnosis not present

## 2014-12-23 DIAGNOSIS — R51 Headache: Secondary | ICD-10-CM | POA: Diagnosis not present

## 2014-12-23 DIAGNOSIS — I10 Essential (primary) hypertension: Secondary | ICD-10-CM | POA: Diagnosis not present

## 2014-12-23 DIAGNOSIS — R410 Disorientation, unspecified: Secondary | ICD-10-CM | POA: Diagnosis not present

## 2014-12-23 DIAGNOSIS — R531 Weakness: Secondary | ICD-10-CM | POA: Diagnosis not present

## 2014-12-23 DIAGNOSIS — J811 Chronic pulmonary edema: Secondary | ICD-10-CM | POA: Diagnosis not present

## 2014-12-23 DIAGNOSIS — M545 Low back pain: Secondary | ICD-10-CM | POA: Diagnosis not present

## 2014-12-23 DIAGNOSIS — N3000 Acute cystitis without hematuria: Secondary | ICD-10-CM | POA: Diagnosis not present

## 2014-12-23 DIAGNOSIS — M542 Cervicalgia: Secondary | ICD-10-CM | POA: Diagnosis not present

## 2014-12-24 DIAGNOSIS — M545 Low back pain: Secondary | ICD-10-CM | POA: Diagnosis not present

## 2014-12-24 DIAGNOSIS — N3000 Acute cystitis without hematuria: Secondary | ICD-10-CM | POA: Diagnosis not present

## 2014-12-24 DIAGNOSIS — N390 Urinary tract infection, site not specified: Secondary | ICD-10-CM | POA: Diagnosis not present

## 2014-12-24 DIAGNOSIS — E780 Pure hypercholesterolemia: Secondary | ICD-10-CM | POA: Diagnosis not present

## 2014-12-24 DIAGNOSIS — J811 Chronic pulmonary edema: Secondary | ICD-10-CM | POA: Diagnosis not present

## 2014-12-24 DIAGNOSIS — R4182 Altered mental status, unspecified: Secondary | ICD-10-CM | POA: Diagnosis not present

## 2014-12-24 DIAGNOSIS — F418 Other specified anxiety disorders: Secondary | ICD-10-CM | POA: Diagnosis present

## 2014-12-24 DIAGNOSIS — R030 Elevated blood-pressure reading, without diagnosis of hypertension: Secondary | ICD-10-CM | POA: Diagnosis not present

## 2014-12-24 DIAGNOSIS — G92 Toxic encephalopathy: Secondary | ICD-10-CM | POA: Diagnosis present

## 2014-12-24 DIAGNOSIS — I10 Essential (primary) hypertension: Secondary | ICD-10-CM | POA: Diagnosis not present

## 2014-12-24 DIAGNOSIS — R51 Headache: Secondary | ICD-10-CM | POA: Diagnosis not present

## 2014-12-24 DIAGNOSIS — Z043 Encounter for examination and observation following other accident: Secondary | ICD-10-CM | POA: Diagnosis not present

## 2014-12-24 DIAGNOSIS — K219 Gastro-esophageal reflux disease without esophagitis: Secondary | ICD-10-CM | POA: Diagnosis present

## 2014-12-24 DIAGNOSIS — I69854 Hemiplegia and hemiparesis following other cerebrovascular disease affecting left non-dominant side: Secondary | ICD-10-CM | POA: Diagnosis not present

## 2014-12-24 DIAGNOSIS — I69954 Hemiplegia and hemiparesis following unspecified cerebrovascular disease affecting left non-dominant side: Secondary | ICD-10-CM | POA: Diagnosis not present

## 2014-12-24 DIAGNOSIS — Z87891 Personal history of nicotine dependence: Secondary | ICD-10-CM | POA: Diagnosis not present

## 2014-12-24 DIAGNOSIS — R410 Disorientation, unspecified: Secondary | ICD-10-CM | POA: Diagnosis not present

## 2014-12-24 DIAGNOSIS — I252 Old myocardial infarction: Secondary | ICD-10-CM | POA: Diagnosis not present

## 2014-12-24 DIAGNOSIS — M1990 Unspecified osteoarthritis, unspecified site: Secondary | ICD-10-CM | POA: Diagnosis present

## 2014-12-24 DIAGNOSIS — S299XXA Unspecified injury of thorax, initial encounter: Secondary | ICD-10-CM | POA: Diagnosis not present

## 2014-12-24 DIAGNOSIS — R42 Dizziness and giddiness: Secondary | ICD-10-CM | POA: Diagnosis not present

## 2014-12-24 DIAGNOSIS — F419 Anxiety disorder, unspecified: Secondary | ICD-10-CM | POA: Diagnosis not present

## 2014-12-24 DIAGNOSIS — S3992XA Unspecified injury of lower back, initial encounter: Secondary | ICD-10-CM | POA: Diagnosis not present

## 2014-12-24 DIAGNOSIS — J449 Chronic obstructive pulmonary disease, unspecified: Secondary | ICD-10-CM | POA: Diagnosis present

## 2014-12-24 DIAGNOSIS — R531 Weakness: Secondary | ICD-10-CM | POA: Diagnosis not present

## 2014-12-24 DIAGNOSIS — Z79899 Other long term (current) drug therapy: Secondary | ICD-10-CM | POA: Diagnosis not present

## 2014-12-24 DIAGNOSIS — E860 Dehydration: Secondary | ICD-10-CM | POA: Diagnosis not present

## 2014-12-24 DIAGNOSIS — R319 Hematuria, unspecified: Secondary | ICD-10-CM | POA: Diagnosis present

## 2014-12-24 DIAGNOSIS — I517 Cardiomegaly: Secondary | ICD-10-CM | POA: Diagnosis not present

## 2014-12-24 DIAGNOSIS — R261 Paralytic gait: Secondary | ICD-10-CM | POA: Diagnosis not present

## 2014-12-24 DIAGNOSIS — F4310 Post-traumatic stress disorder, unspecified: Secondary | ICD-10-CM | POA: Diagnosis not present

## 2014-12-24 DIAGNOSIS — Z91040 Latex allergy status: Secondary | ICD-10-CM | POA: Diagnosis not present

## 2014-12-24 DIAGNOSIS — Z888 Allergy status to other drugs, medicaments and biological substances status: Secondary | ICD-10-CM | POA: Diagnosis not present

## 2014-12-25 DIAGNOSIS — F4310 Post-traumatic stress disorder, unspecified: Secondary | ICD-10-CM | POA: Diagnosis not present

## 2014-12-25 DIAGNOSIS — R261 Paralytic gait: Secondary | ICD-10-CM | POA: Diagnosis not present

## 2014-12-25 DIAGNOSIS — F419 Anxiety disorder, unspecified: Secondary | ICD-10-CM | POA: Diagnosis not present

## 2014-12-25 DIAGNOSIS — J449 Chronic obstructive pulmonary disease, unspecified: Secondary | ICD-10-CM | POA: Diagnosis not present

## 2014-12-25 DIAGNOSIS — I10 Essential (primary) hypertension: Secondary | ICD-10-CM | POA: Diagnosis not present

## 2014-12-25 DIAGNOSIS — I69854 Hemiplegia and hemiparesis following other cerebrovascular disease affecting left non-dominant side: Secondary | ICD-10-CM | POA: Diagnosis not present

## 2014-12-29 DIAGNOSIS — J449 Chronic obstructive pulmonary disease, unspecified: Secondary | ICD-10-CM | POA: Diagnosis not present

## 2014-12-29 DIAGNOSIS — F419 Anxiety disorder, unspecified: Secondary | ICD-10-CM | POA: Diagnosis not present

## 2014-12-29 DIAGNOSIS — I10 Essential (primary) hypertension: Secondary | ICD-10-CM | POA: Diagnosis not present

## 2014-12-29 DIAGNOSIS — I69854 Hemiplegia and hemiparesis following other cerebrovascular disease affecting left non-dominant side: Secondary | ICD-10-CM | POA: Diagnosis not present

## 2014-12-29 DIAGNOSIS — F4310 Post-traumatic stress disorder, unspecified: Secondary | ICD-10-CM | POA: Diagnosis not present

## 2014-12-29 DIAGNOSIS — R261 Paralytic gait: Secondary | ICD-10-CM | POA: Diagnosis not present

## 2015-01-01 DIAGNOSIS — F4310 Post-traumatic stress disorder, unspecified: Secondary | ICD-10-CM | POA: Diagnosis not present

## 2015-01-01 DIAGNOSIS — J449 Chronic obstructive pulmonary disease, unspecified: Secondary | ICD-10-CM | POA: Diagnosis not present

## 2015-01-01 DIAGNOSIS — R261 Paralytic gait: Secondary | ICD-10-CM | POA: Diagnosis not present

## 2015-01-01 DIAGNOSIS — I69854 Hemiplegia and hemiparesis following other cerebrovascular disease affecting left non-dominant side: Secondary | ICD-10-CM | POA: Diagnosis not present

## 2015-01-01 DIAGNOSIS — F419 Anxiety disorder, unspecified: Secondary | ICD-10-CM | POA: Diagnosis not present

## 2015-01-01 DIAGNOSIS — I10 Essential (primary) hypertension: Secondary | ICD-10-CM | POA: Diagnosis not present

## 2015-01-02 DIAGNOSIS — R261 Paralytic gait: Secondary | ICD-10-CM | POA: Diagnosis not present

## 2015-01-02 DIAGNOSIS — I10 Essential (primary) hypertension: Secondary | ICD-10-CM | POA: Diagnosis not present

## 2015-01-02 DIAGNOSIS — F419 Anxiety disorder, unspecified: Secondary | ICD-10-CM | POA: Diagnosis not present

## 2015-01-02 DIAGNOSIS — I69854 Hemiplegia and hemiparesis following other cerebrovascular disease affecting left non-dominant side: Secondary | ICD-10-CM | POA: Diagnosis not present

## 2015-01-02 DIAGNOSIS — F4310 Post-traumatic stress disorder, unspecified: Secondary | ICD-10-CM | POA: Diagnosis not present

## 2015-01-02 DIAGNOSIS — J449 Chronic obstructive pulmonary disease, unspecified: Secondary | ICD-10-CM | POA: Diagnosis not present

## 2015-01-03 DIAGNOSIS — F419 Anxiety disorder, unspecified: Secondary | ICD-10-CM | POA: Diagnosis not present

## 2015-01-03 DIAGNOSIS — F4310 Post-traumatic stress disorder, unspecified: Secondary | ICD-10-CM | POA: Diagnosis not present

## 2015-01-03 DIAGNOSIS — R261 Paralytic gait: Secondary | ICD-10-CM | POA: Diagnosis not present

## 2015-01-03 DIAGNOSIS — I10 Essential (primary) hypertension: Secondary | ICD-10-CM | POA: Diagnosis not present

## 2015-01-03 DIAGNOSIS — I69854 Hemiplegia and hemiparesis following other cerebrovascular disease affecting left non-dominant side: Secondary | ICD-10-CM | POA: Diagnosis not present

## 2015-01-03 DIAGNOSIS — J449 Chronic obstructive pulmonary disease, unspecified: Secondary | ICD-10-CM | POA: Diagnosis not present

## 2015-01-04 DIAGNOSIS — I69854 Hemiplegia and hemiparesis following other cerebrovascular disease affecting left non-dominant side: Secondary | ICD-10-CM | POA: Diagnosis not present

## 2015-01-04 DIAGNOSIS — F419 Anxiety disorder, unspecified: Secondary | ICD-10-CM | POA: Diagnosis not present

## 2015-01-04 DIAGNOSIS — I10 Essential (primary) hypertension: Secondary | ICD-10-CM | POA: Diagnosis not present

## 2015-01-04 DIAGNOSIS — F4310 Post-traumatic stress disorder, unspecified: Secondary | ICD-10-CM | POA: Diagnosis not present

## 2015-01-04 DIAGNOSIS — J449 Chronic obstructive pulmonary disease, unspecified: Secondary | ICD-10-CM | POA: Diagnosis not present

## 2015-01-04 DIAGNOSIS — R261 Paralytic gait: Secondary | ICD-10-CM | POA: Diagnosis not present

## 2015-01-07 DIAGNOSIS — J449 Chronic obstructive pulmonary disease, unspecified: Secondary | ICD-10-CM | POA: Diagnosis not present

## 2015-01-07 DIAGNOSIS — F419 Anxiety disorder, unspecified: Secondary | ICD-10-CM | POA: Diagnosis not present

## 2015-01-07 DIAGNOSIS — F4310 Post-traumatic stress disorder, unspecified: Secondary | ICD-10-CM | POA: Diagnosis not present

## 2015-01-07 DIAGNOSIS — R261 Paralytic gait: Secondary | ICD-10-CM | POA: Diagnosis not present

## 2015-01-07 DIAGNOSIS — I69854 Hemiplegia and hemiparesis following other cerebrovascular disease affecting left non-dominant side: Secondary | ICD-10-CM | POA: Diagnosis not present

## 2015-01-07 DIAGNOSIS — I10 Essential (primary) hypertension: Secondary | ICD-10-CM | POA: Diagnosis not present

## 2015-01-08 DIAGNOSIS — J449 Chronic obstructive pulmonary disease, unspecified: Secondary | ICD-10-CM | POA: Diagnosis not present

## 2015-01-08 DIAGNOSIS — I10 Essential (primary) hypertension: Secondary | ICD-10-CM | POA: Diagnosis not present

## 2015-01-08 DIAGNOSIS — F419 Anxiety disorder, unspecified: Secondary | ICD-10-CM | POA: Diagnosis not present

## 2015-01-08 DIAGNOSIS — R261 Paralytic gait: Secondary | ICD-10-CM | POA: Diagnosis not present

## 2015-01-08 DIAGNOSIS — I69854 Hemiplegia and hemiparesis following other cerebrovascular disease affecting left non-dominant side: Secondary | ICD-10-CM | POA: Diagnosis not present

## 2015-01-08 DIAGNOSIS — F4310 Post-traumatic stress disorder, unspecified: Secondary | ICD-10-CM | POA: Diagnosis not present

## 2015-01-09 DIAGNOSIS — F331 Major depressive disorder, recurrent, moderate: Secondary | ICD-10-CM | POA: Diagnosis not present

## 2015-01-10 DIAGNOSIS — J449 Chronic obstructive pulmonary disease, unspecified: Secondary | ICD-10-CM | POA: Diagnosis not present

## 2015-01-10 DIAGNOSIS — F4310 Post-traumatic stress disorder, unspecified: Secondary | ICD-10-CM | POA: Diagnosis not present

## 2015-01-10 DIAGNOSIS — R261 Paralytic gait: Secondary | ICD-10-CM | POA: Diagnosis not present

## 2015-01-10 DIAGNOSIS — I69854 Hemiplegia and hemiparesis following other cerebrovascular disease affecting left non-dominant side: Secondary | ICD-10-CM | POA: Diagnosis not present

## 2015-01-10 DIAGNOSIS — F419 Anxiety disorder, unspecified: Secondary | ICD-10-CM | POA: Diagnosis not present

## 2015-01-10 DIAGNOSIS — I10 Essential (primary) hypertension: Secondary | ICD-10-CM | POA: Diagnosis not present

## 2015-01-11 DIAGNOSIS — J449 Chronic obstructive pulmonary disease, unspecified: Secondary | ICD-10-CM | POA: Diagnosis not present

## 2015-01-11 DIAGNOSIS — R261 Paralytic gait: Secondary | ICD-10-CM | POA: Diagnosis not present

## 2015-01-11 DIAGNOSIS — I69854 Hemiplegia and hemiparesis following other cerebrovascular disease affecting left non-dominant side: Secondary | ICD-10-CM | POA: Diagnosis not present

## 2015-01-11 DIAGNOSIS — I10 Essential (primary) hypertension: Secondary | ICD-10-CM | POA: Diagnosis not present

## 2015-01-11 DIAGNOSIS — N390 Urinary tract infection, site not specified: Secondary | ICD-10-CM | POA: Diagnosis not present

## 2015-01-11 DIAGNOSIS — F4310 Post-traumatic stress disorder, unspecified: Secondary | ICD-10-CM | POA: Diagnosis not present

## 2015-01-11 DIAGNOSIS — F419 Anxiety disorder, unspecified: Secondary | ICD-10-CM | POA: Diagnosis not present

## 2015-01-14 DIAGNOSIS — F4310 Post-traumatic stress disorder, unspecified: Secondary | ICD-10-CM | POA: Diagnosis not present

## 2015-01-14 DIAGNOSIS — I69854 Hemiplegia and hemiparesis following other cerebrovascular disease affecting left non-dominant side: Secondary | ICD-10-CM | POA: Diagnosis not present

## 2015-01-14 DIAGNOSIS — R261 Paralytic gait: Secondary | ICD-10-CM | POA: Diagnosis not present

## 2015-01-14 DIAGNOSIS — F419 Anxiety disorder, unspecified: Secondary | ICD-10-CM | POA: Diagnosis not present

## 2015-01-14 DIAGNOSIS — J449 Chronic obstructive pulmonary disease, unspecified: Secondary | ICD-10-CM | POA: Diagnosis not present

## 2015-01-14 DIAGNOSIS — I10 Essential (primary) hypertension: Secondary | ICD-10-CM | POA: Diagnosis not present

## 2015-01-16 DIAGNOSIS — I69854 Hemiplegia and hemiparesis following other cerebrovascular disease affecting left non-dominant side: Secondary | ICD-10-CM | POA: Diagnosis not present

## 2015-01-16 DIAGNOSIS — R261 Paralytic gait: Secondary | ICD-10-CM | POA: Diagnosis not present

## 2015-01-16 DIAGNOSIS — J449 Chronic obstructive pulmonary disease, unspecified: Secondary | ICD-10-CM | POA: Diagnosis not present

## 2015-01-16 DIAGNOSIS — F419 Anxiety disorder, unspecified: Secondary | ICD-10-CM | POA: Diagnosis not present

## 2015-01-16 DIAGNOSIS — I10 Essential (primary) hypertension: Secondary | ICD-10-CM | POA: Diagnosis not present

## 2015-01-16 DIAGNOSIS — F4310 Post-traumatic stress disorder, unspecified: Secondary | ICD-10-CM | POA: Diagnosis not present

## 2015-01-17 DIAGNOSIS — E876 Hypokalemia: Secondary | ICD-10-CM | POA: Diagnosis not present

## 2015-01-17 DIAGNOSIS — I10 Essential (primary) hypertension: Secondary | ICD-10-CM | POA: Diagnosis not present

## 2015-01-17 DIAGNOSIS — Z09 Encounter for follow-up examination after completed treatment for conditions other than malignant neoplasm: Secondary | ICD-10-CM | POA: Diagnosis not present

## 2015-01-17 DIAGNOSIS — M179 Osteoarthritis of knee, unspecified: Secondary | ICD-10-CM | POA: Diagnosis not present

## 2015-01-17 DIAGNOSIS — E559 Vitamin D deficiency, unspecified: Secondary | ICD-10-CM | POA: Diagnosis not present

## 2015-01-17 DIAGNOSIS — Z6825 Body mass index (BMI) 25.0-25.9, adult: Secondary | ICD-10-CM | POA: Diagnosis not present

## 2015-01-18 DIAGNOSIS — H2513 Age-related nuclear cataract, bilateral: Secondary | ICD-10-CM | POA: Diagnosis not present

## 2015-01-18 DIAGNOSIS — H524 Presbyopia: Secondary | ICD-10-CM | POA: Diagnosis not present

## 2015-01-18 DIAGNOSIS — H5347 Heteronymous bilateral field defects: Secondary | ICD-10-CM | POA: Diagnosis not present

## 2015-01-21 DIAGNOSIS — J449 Chronic obstructive pulmonary disease, unspecified: Secondary | ICD-10-CM | POA: Diagnosis not present

## 2015-01-21 DIAGNOSIS — F4310 Post-traumatic stress disorder, unspecified: Secondary | ICD-10-CM | POA: Diagnosis not present

## 2015-01-21 DIAGNOSIS — R261 Paralytic gait: Secondary | ICD-10-CM | POA: Diagnosis not present

## 2015-01-21 DIAGNOSIS — I69854 Hemiplegia and hemiparesis following other cerebrovascular disease affecting left non-dominant side: Secondary | ICD-10-CM | POA: Diagnosis not present

## 2015-01-21 DIAGNOSIS — F419 Anxiety disorder, unspecified: Secondary | ICD-10-CM | POA: Diagnosis not present

## 2015-01-21 DIAGNOSIS — I10 Essential (primary) hypertension: Secondary | ICD-10-CM | POA: Diagnosis not present

## 2015-01-22 DIAGNOSIS — J449 Chronic obstructive pulmonary disease, unspecified: Secondary | ICD-10-CM | POA: Diagnosis not present

## 2015-01-22 DIAGNOSIS — I69854 Hemiplegia and hemiparesis following other cerebrovascular disease affecting left non-dominant side: Secondary | ICD-10-CM | POA: Diagnosis not present

## 2015-01-22 DIAGNOSIS — R261 Paralytic gait: Secondary | ICD-10-CM | POA: Diagnosis not present

## 2015-01-22 DIAGNOSIS — F4310 Post-traumatic stress disorder, unspecified: Secondary | ICD-10-CM | POA: Diagnosis not present

## 2015-01-22 DIAGNOSIS — I10 Essential (primary) hypertension: Secondary | ICD-10-CM | POA: Diagnosis not present

## 2015-01-22 DIAGNOSIS — F419 Anxiety disorder, unspecified: Secondary | ICD-10-CM | POA: Diagnosis not present

## 2015-01-23 DIAGNOSIS — F4310 Post-traumatic stress disorder, unspecified: Secondary | ICD-10-CM | POA: Diagnosis not present

## 2015-01-23 DIAGNOSIS — J449 Chronic obstructive pulmonary disease, unspecified: Secondary | ICD-10-CM | POA: Diagnosis not present

## 2015-01-23 DIAGNOSIS — I69854 Hemiplegia and hemiparesis following other cerebrovascular disease affecting left non-dominant side: Secondary | ICD-10-CM | POA: Diagnosis not present

## 2015-01-23 DIAGNOSIS — R261 Paralytic gait: Secondary | ICD-10-CM | POA: Diagnosis not present

## 2015-01-23 DIAGNOSIS — I10 Essential (primary) hypertension: Secondary | ICD-10-CM | POA: Diagnosis not present

## 2015-01-23 DIAGNOSIS — F419 Anxiety disorder, unspecified: Secondary | ICD-10-CM | POA: Diagnosis not present

## 2015-02-07 DIAGNOSIS — Z86000 Personal history of in-situ neoplasm of breast: Secondary | ICD-10-CM | POA: Diagnosis not present

## 2015-02-07 DIAGNOSIS — Z853 Personal history of malignant neoplasm of breast: Secondary | ICD-10-CM | POA: Diagnosis not present

## 2015-02-07 DIAGNOSIS — Z8673 Personal history of transient ischemic attack (TIA), and cerebral infarction without residual deficits: Secondary | ICD-10-CM | POA: Diagnosis not present

## 2015-02-12 DIAGNOSIS — I639 Cerebral infarction, unspecified: Secondary | ICD-10-CM | POA: Diagnosis not present

## 2015-02-12 DIAGNOSIS — R2689 Other abnormalities of gait and mobility: Secondary | ICD-10-CM | POA: Diagnosis not present

## 2015-02-12 DIAGNOSIS — M6281 Muscle weakness (generalized): Secondary | ICD-10-CM | POA: Diagnosis not present

## 2015-02-18 DIAGNOSIS — R2689 Other abnormalities of gait and mobility: Secondary | ICD-10-CM | POA: Diagnosis not present

## 2015-02-18 DIAGNOSIS — I639 Cerebral infarction, unspecified: Secondary | ICD-10-CM | POA: Diagnosis not present

## 2015-02-18 DIAGNOSIS — M6281 Muscle weakness (generalized): Secondary | ICD-10-CM | POA: Diagnosis not present

## 2015-02-25 DIAGNOSIS — M6281 Muscle weakness (generalized): Secondary | ICD-10-CM | POA: Diagnosis not present

## 2015-02-25 DIAGNOSIS — I639 Cerebral infarction, unspecified: Secondary | ICD-10-CM | POA: Diagnosis not present

## 2015-02-25 DIAGNOSIS — R2689 Other abnormalities of gait and mobility: Secondary | ICD-10-CM | POA: Diagnosis not present

## 2015-02-27 DIAGNOSIS — M6281 Muscle weakness (generalized): Secondary | ICD-10-CM | POA: Diagnosis not present

## 2015-02-27 DIAGNOSIS — R2689 Other abnormalities of gait and mobility: Secondary | ICD-10-CM | POA: Diagnosis not present

## 2015-02-27 DIAGNOSIS — I639 Cerebral infarction, unspecified: Secondary | ICD-10-CM | POA: Diagnosis not present

## 2015-03-07 DIAGNOSIS — R2689 Other abnormalities of gait and mobility: Secondary | ICD-10-CM | POA: Diagnosis not present

## 2015-03-07 DIAGNOSIS — M6281 Muscle weakness (generalized): Secondary | ICD-10-CM | POA: Diagnosis not present

## 2015-03-07 DIAGNOSIS — I639 Cerebral infarction, unspecified: Secondary | ICD-10-CM | POA: Diagnosis not present

## 2015-03-19 DIAGNOSIS — M25511 Pain in right shoulder: Secondary | ICD-10-CM | POA: Diagnosis not present

## 2015-03-19 DIAGNOSIS — I10 Essential (primary) hypertension: Secondary | ICD-10-CM | POA: Diagnosis not present

## 2015-03-19 DIAGNOSIS — J449 Chronic obstructive pulmonary disease, unspecified: Secondary | ICD-10-CM | POA: Diagnosis not present

## 2015-03-19 DIAGNOSIS — Z6825 Body mass index (BMI) 25.0-25.9, adult: Secondary | ICD-10-CM | POA: Diagnosis not present

## 2015-03-19 DIAGNOSIS — M1990 Unspecified osteoarthritis, unspecified site: Secondary | ICD-10-CM | POA: Diagnosis not present

## 2015-03-19 DIAGNOSIS — E785 Hyperlipidemia, unspecified: Secondary | ICD-10-CM | POA: Diagnosis not present

## 2015-03-19 DIAGNOSIS — F418 Other specified anxiety disorders: Secondary | ICD-10-CM | POA: Diagnosis not present

## 2015-03-19 DIAGNOSIS — Z23 Encounter for immunization: Secondary | ICD-10-CM | POA: Diagnosis not present

## 2015-03-20 DIAGNOSIS — M1711 Unilateral primary osteoarthritis, right knee: Secondary | ICD-10-CM | POA: Diagnosis not present

## 2015-03-20 DIAGNOSIS — M25511 Pain in right shoulder: Secondary | ICD-10-CM | POA: Diagnosis not present

## 2015-03-20 DIAGNOSIS — R2689 Other abnormalities of gait and mobility: Secondary | ICD-10-CM | POA: Diagnosis not present

## 2015-03-20 DIAGNOSIS — M179 Osteoarthritis of knee, unspecified: Secondary | ICD-10-CM | POA: Diagnosis not present

## 2015-03-20 DIAGNOSIS — I639 Cerebral infarction, unspecified: Secondary | ICD-10-CM | POA: Diagnosis not present

## 2015-03-20 DIAGNOSIS — M1990 Unspecified osteoarthritis, unspecified site: Secondary | ICD-10-CM | POA: Diagnosis not present

## 2015-03-20 DIAGNOSIS — M25512 Pain in left shoulder: Secondary | ICD-10-CM | POA: Diagnosis not present

## 2015-03-20 DIAGNOSIS — M19011 Primary osteoarthritis, right shoulder: Secondary | ICD-10-CM | POA: Diagnosis not present

## 2015-03-20 DIAGNOSIS — M6281 Muscle weakness (generalized): Secondary | ICD-10-CM | POA: Diagnosis not present

## 2015-03-24 DIAGNOSIS — F17200 Nicotine dependence, unspecified, uncomplicated: Secondary | ICD-10-CM | POA: Diagnosis not present

## 2015-03-24 DIAGNOSIS — R42 Dizziness and giddiness: Secondary | ICD-10-CM | POA: Diagnosis not present

## 2015-03-24 DIAGNOSIS — J449 Chronic obstructive pulmonary disease, unspecified: Secondary | ICD-10-CM | POA: Diagnosis not present

## 2015-03-24 DIAGNOSIS — M25511 Pain in right shoulder: Secondary | ICD-10-CM | POA: Diagnosis not present

## 2015-03-24 DIAGNOSIS — I10 Essential (primary) hypertension: Secondary | ICD-10-CM | POA: Diagnosis not present

## 2015-03-24 DIAGNOSIS — Z79891 Long term (current) use of opiate analgesic: Secondary | ICD-10-CM | POA: Diagnosis not present

## 2015-03-24 DIAGNOSIS — Z9181 History of falling: Secondary | ICD-10-CM | POA: Diagnosis not present

## 2015-03-24 DIAGNOSIS — R296 Repeated falls: Secondary | ICD-10-CM | POA: Diagnosis not present

## 2015-03-24 DIAGNOSIS — F413 Other mixed anxiety disorders: Secondary | ICD-10-CM | POA: Diagnosis not present

## 2015-03-24 DIAGNOSIS — Z8673 Personal history of transient ischemic attack (TIA), and cerebral infarction without residual deficits: Secondary | ICD-10-CM | POA: Diagnosis not present

## 2015-03-24 DIAGNOSIS — M25512 Pain in left shoulder: Secondary | ICD-10-CM | POA: Diagnosis not present

## 2015-03-24 DIAGNOSIS — M25561 Pain in right knee: Secondary | ICD-10-CM | POA: Diagnosis not present

## 2015-03-24 DIAGNOSIS — R262 Difficulty in walking, not elsewhere classified: Secondary | ICD-10-CM | POA: Diagnosis not present

## 2015-03-24 DIAGNOSIS — M1990 Unspecified osteoarthritis, unspecified site: Secondary | ICD-10-CM | POA: Diagnosis not present

## 2015-03-25 DIAGNOSIS — M1711 Unilateral primary osteoarthritis, right knee: Secondary | ICD-10-CM | POA: Diagnosis not present

## 2015-03-25 DIAGNOSIS — M25561 Pain in right knee: Secondary | ICD-10-CM | POA: Diagnosis not present

## 2015-03-26 DIAGNOSIS — R296 Repeated falls: Secondary | ICD-10-CM | POA: Diagnosis not present

## 2015-03-26 DIAGNOSIS — M25511 Pain in right shoulder: Secondary | ICD-10-CM | POA: Diagnosis not present

## 2015-03-26 DIAGNOSIS — M25512 Pain in left shoulder: Secondary | ICD-10-CM | POA: Diagnosis not present

## 2015-03-26 DIAGNOSIS — F413 Other mixed anxiety disorders: Secondary | ICD-10-CM | POA: Diagnosis not present

## 2015-03-26 DIAGNOSIS — R262 Difficulty in walking, not elsewhere classified: Secondary | ICD-10-CM | POA: Diagnosis not present

## 2015-03-26 DIAGNOSIS — M25561 Pain in right knee: Secondary | ICD-10-CM | POA: Diagnosis not present

## 2015-03-28 DIAGNOSIS — R296 Repeated falls: Secondary | ICD-10-CM | POA: Diagnosis not present

## 2015-03-28 DIAGNOSIS — F413 Other mixed anxiety disorders: Secondary | ICD-10-CM | POA: Diagnosis not present

## 2015-03-28 DIAGNOSIS — R262 Difficulty in walking, not elsewhere classified: Secondary | ICD-10-CM | POA: Diagnosis not present

## 2015-03-28 DIAGNOSIS — M25511 Pain in right shoulder: Secondary | ICD-10-CM | POA: Diagnosis not present

## 2015-03-28 DIAGNOSIS — M25561 Pain in right knee: Secondary | ICD-10-CM | POA: Diagnosis not present

## 2015-03-28 DIAGNOSIS — M25512 Pain in left shoulder: Secondary | ICD-10-CM | POA: Diagnosis not present

## 2015-04-01 DIAGNOSIS — F413 Other mixed anxiety disorders: Secondary | ICD-10-CM | POA: Diagnosis not present

## 2015-04-01 DIAGNOSIS — M25512 Pain in left shoulder: Secondary | ICD-10-CM | POA: Diagnosis not present

## 2015-04-01 DIAGNOSIS — M25561 Pain in right knee: Secondary | ICD-10-CM | POA: Diagnosis not present

## 2015-04-01 DIAGNOSIS — M25511 Pain in right shoulder: Secondary | ICD-10-CM | POA: Diagnosis not present

## 2015-04-01 DIAGNOSIS — R262 Difficulty in walking, not elsewhere classified: Secondary | ICD-10-CM | POA: Diagnosis not present

## 2015-04-01 DIAGNOSIS — R296 Repeated falls: Secondary | ICD-10-CM | POA: Diagnosis not present

## 2015-04-02 DIAGNOSIS — F413 Other mixed anxiety disorders: Secondary | ICD-10-CM | POA: Diagnosis not present

## 2015-04-02 DIAGNOSIS — R296 Repeated falls: Secondary | ICD-10-CM | POA: Diagnosis not present

## 2015-04-02 DIAGNOSIS — M25511 Pain in right shoulder: Secondary | ICD-10-CM | POA: Diagnosis not present

## 2015-04-02 DIAGNOSIS — M25561 Pain in right knee: Secondary | ICD-10-CM | POA: Diagnosis not present

## 2015-04-02 DIAGNOSIS — R262 Difficulty in walking, not elsewhere classified: Secondary | ICD-10-CM | POA: Diagnosis not present

## 2015-04-02 DIAGNOSIS — M25512 Pain in left shoulder: Secondary | ICD-10-CM | POA: Diagnosis not present

## 2015-04-04 DIAGNOSIS — R296 Repeated falls: Secondary | ICD-10-CM | POA: Diagnosis not present

## 2015-04-04 DIAGNOSIS — M25512 Pain in left shoulder: Secondary | ICD-10-CM | POA: Diagnosis not present

## 2015-04-04 DIAGNOSIS — F413 Other mixed anxiety disorders: Secondary | ICD-10-CM | POA: Diagnosis not present

## 2015-04-04 DIAGNOSIS — R262 Difficulty in walking, not elsewhere classified: Secondary | ICD-10-CM | POA: Diagnosis not present

## 2015-04-04 DIAGNOSIS — R921 Mammographic calcification found on diagnostic imaging of breast: Secondary | ICD-10-CM | POA: Diagnosis not present

## 2015-04-04 DIAGNOSIS — M25561 Pain in right knee: Secondary | ICD-10-CM | POA: Diagnosis not present

## 2015-04-04 DIAGNOSIS — M25511 Pain in right shoulder: Secondary | ICD-10-CM | POA: Diagnosis not present

## 2015-04-04 DIAGNOSIS — C50412 Malignant neoplasm of upper-outer quadrant of left female breast: Secondary | ICD-10-CM | POA: Diagnosis not present

## 2015-04-05 DIAGNOSIS — M25512 Pain in left shoulder: Secondary | ICD-10-CM | POA: Diagnosis not present

## 2015-04-05 DIAGNOSIS — R296 Repeated falls: Secondary | ICD-10-CM | POA: Diagnosis not present

## 2015-04-05 DIAGNOSIS — R262 Difficulty in walking, not elsewhere classified: Secondary | ICD-10-CM | POA: Diagnosis not present

## 2015-04-05 DIAGNOSIS — F413 Other mixed anxiety disorders: Secondary | ICD-10-CM | POA: Diagnosis not present

## 2015-04-05 DIAGNOSIS — M25561 Pain in right knee: Secondary | ICD-10-CM | POA: Diagnosis not present

## 2015-04-05 DIAGNOSIS — M25511 Pain in right shoulder: Secondary | ICD-10-CM | POA: Diagnosis not present

## 2015-04-08 DIAGNOSIS — R262 Difficulty in walking, not elsewhere classified: Secondary | ICD-10-CM | POA: Diagnosis not present

## 2015-04-08 DIAGNOSIS — M25561 Pain in right knee: Secondary | ICD-10-CM | POA: Diagnosis not present

## 2015-04-08 DIAGNOSIS — M25512 Pain in left shoulder: Secondary | ICD-10-CM | POA: Diagnosis not present

## 2015-04-08 DIAGNOSIS — M25511 Pain in right shoulder: Secondary | ICD-10-CM | POA: Diagnosis not present

## 2015-04-08 DIAGNOSIS — R296 Repeated falls: Secondary | ICD-10-CM | POA: Diagnosis not present

## 2015-04-08 DIAGNOSIS — F413 Other mixed anxiety disorders: Secondary | ICD-10-CM | POA: Diagnosis not present

## 2015-04-09 DIAGNOSIS — M25561 Pain in right knee: Secondary | ICD-10-CM | POA: Diagnosis not present

## 2015-04-09 DIAGNOSIS — F413 Other mixed anxiety disorders: Secondary | ICD-10-CM | POA: Diagnosis not present

## 2015-04-09 DIAGNOSIS — R262 Difficulty in walking, not elsewhere classified: Secondary | ICD-10-CM | POA: Diagnosis not present

## 2015-04-09 DIAGNOSIS — M25512 Pain in left shoulder: Secondary | ICD-10-CM | POA: Diagnosis not present

## 2015-04-09 DIAGNOSIS — M25511 Pain in right shoulder: Secondary | ICD-10-CM | POA: Diagnosis not present

## 2015-04-09 DIAGNOSIS — R296 Repeated falls: Secondary | ICD-10-CM | POA: Diagnosis not present

## 2015-04-11 DIAGNOSIS — R296 Repeated falls: Secondary | ICD-10-CM | POA: Diagnosis not present

## 2015-04-11 DIAGNOSIS — R262 Difficulty in walking, not elsewhere classified: Secondary | ICD-10-CM | POA: Diagnosis not present

## 2015-04-11 DIAGNOSIS — F413 Other mixed anxiety disorders: Secondary | ICD-10-CM | POA: Diagnosis not present

## 2015-04-11 DIAGNOSIS — M25512 Pain in left shoulder: Secondary | ICD-10-CM | POA: Diagnosis not present

## 2015-04-11 DIAGNOSIS — M25561 Pain in right knee: Secondary | ICD-10-CM | POA: Diagnosis not present

## 2015-04-11 DIAGNOSIS — M25511 Pain in right shoulder: Secondary | ICD-10-CM | POA: Diagnosis not present

## 2015-04-12 DIAGNOSIS — R296 Repeated falls: Secondary | ICD-10-CM | POA: Diagnosis not present

## 2015-04-12 DIAGNOSIS — F413 Other mixed anxiety disorders: Secondary | ICD-10-CM | POA: Diagnosis not present

## 2015-04-12 DIAGNOSIS — M25511 Pain in right shoulder: Secondary | ICD-10-CM | POA: Diagnosis not present

## 2015-04-12 DIAGNOSIS — M25512 Pain in left shoulder: Secondary | ICD-10-CM | POA: Diagnosis not present

## 2015-04-12 DIAGNOSIS — R262 Difficulty in walking, not elsewhere classified: Secondary | ICD-10-CM | POA: Diagnosis not present

## 2015-04-12 DIAGNOSIS — M25561 Pain in right knee: Secondary | ICD-10-CM | POA: Diagnosis not present

## 2015-04-24 DIAGNOSIS — F413 Other mixed anxiety disorders: Secondary | ICD-10-CM | POA: Diagnosis not present

## 2015-04-24 DIAGNOSIS — M25561 Pain in right knee: Secondary | ICD-10-CM | POA: Diagnosis not present

## 2015-04-24 DIAGNOSIS — R296 Repeated falls: Secondary | ICD-10-CM | POA: Diagnosis not present

## 2015-04-24 DIAGNOSIS — M25512 Pain in left shoulder: Secondary | ICD-10-CM | POA: Diagnosis not present

## 2015-04-24 DIAGNOSIS — R262 Difficulty in walking, not elsewhere classified: Secondary | ICD-10-CM | POA: Diagnosis not present

## 2015-04-24 DIAGNOSIS — M25511 Pain in right shoulder: Secondary | ICD-10-CM | POA: Diagnosis not present

## 2015-05-01 DIAGNOSIS — F413 Other mixed anxiety disorders: Secondary | ICD-10-CM | POA: Diagnosis not present

## 2015-05-01 DIAGNOSIS — M25512 Pain in left shoulder: Secondary | ICD-10-CM | POA: Diagnosis not present

## 2015-05-01 DIAGNOSIS — M25561 Pain in right knee: Secondary | ICD-10-CM | POA: Diagnosis not present

## 2015-05-01 DIAGNOSIS — M25511 Pain in right shoulder: Secondary | ICD-10-CM | POA: Diagnosis not present

## 2015-05-01 DIAGNOSIS — R296 Repeated falls: Secondary | ICD-10-CM | POA: Diagnosis not present

## 2015-05-01 DIAGNOSIS — R262 Difficulty in walking, not elsewhere classified: Secondary | ICD-10-CM | POA: Diagnosis not present

## 2015-05-08 DIAGNOSIS — R262 Difficulty in walking, not elsewhere classified: Secondary | ICD-10-CM | POA: Diagnosis not present

## 2015-05-08 DIAGNOSIS — M25511 Pain in right shoulder: Secondary | ICD-10-CM | POA: Diagnosis not present

## 2015-05-08 DIAGNOSIS — R296 Repeated falls: Secondary | ICD-10-CM | POA: Diagnosis not present

## 2015-05-08 DIAGNOSIS — F413 Other mixed anxiety disorders: Secondary | ICD-10-CM | POA: Diagnosis not present

## 2015-05-08 DIAGNOSIS — M25561 Pain in right knee: Secondary | ICD-10-CM | POA: Diagnosis not present

## 2015-05-08 DIAGNOSIS — M25512 Pain in left shoulder: Secondary | ICD-10-CM | POA: Diagnosis not present

## 2015-05-15 DIAGNOSIS — F413 Other mixed anxiety disorders: Secondary | ICD-10-CM | POA: Diagnosis not present

## 2015-05-15 DIAGNOSIS — N390 Urinary tract infection, site not specified: Secondary | ICD-10-CM | POA: Diagnosis not present

## 2015-05-15 DIAGNOSIS — M25512 Pain in left shoulder: Secondary | ICD-10-CM | POA: Diagnosis not present

## 2015-05-15 DIAGNOSIS — M25561 Pain in right knee: Secondary | ICD-10-CM | POA: Diagnosis not present

## 2015-05-15 DIAGNOSIS — M25511 Pain in right shoulder: Secondary | ICD-10-CM | POA: Diagnosis not present

## 2015-05-15 DIAGNOSIS — R262 Difficulty in walking, not elsewhere classified: Secondary | ICD-10-CM | POA: Diagnosis not present

## 2015-05-15 DIAGNOSIS — R296 Repeated falls: Secondary | ICD-10-CM | POA: Diagnosis not present

## 2015-05-16 DIAGNOSIS — R262 Difficulty in walking, not elsewhere classified: Secondary | ICD-10-CM | POA: Diagnosis not present

## 2015-05-16 DIAGNOSIS — M25561 Pain in right knee: Secondary | ICD-10-CM | POA: Diagnosis not present

## 2015-05-16 DIAGNOSIS — I10 Essential (primary) hypertension: Secondary | ICD-10-CM | POA: Diagnosis not present

## 2015-05-16 DIAGNOSIS — M25511 Pain in right shoulder: Secondary | ICD-10-CM | POA: Diagnosis not present

## 2015-05-16 DIAGNOSIS — M25512 Pain in left shoulder: Secondary | ICD-10-CM | POA: Diagnosis not present

## 2015-05-16 DIAGNOSIS — R296 Repeated falls: Secondary | ICD-10-CM | POA: Diagnosis not present

## 2015-05-16 DIAGNOSIS — F413 Other mixed anxiety disorders: Secondary | ICD-10-CM | POA: Diagnosis not present

## 2015-08-08 DIAGNOSIS — Z86000 Personal history of in-situ neoplasm of breast: Secondary | ICD-10-CM | POA: Diagnosis not present

## 2016-03-11 DIAGNOSIS — F331 Major depressive disorder, recurrent, moderate: Secondary | ICD-10-CM | POA: Diagnosis not present

## 2016-04-24 DIAGNOSIS — I252 Old myocardial infarction: Secondary | ICD-10-CM | POA: Diagnosis not present

## 2016-04-24 DIAGNOSIS — R414 Neurologic neglect syndrome: Secondary | ICD-10-CM | POA: Diagnosis not present

## 2016-04-24 DIAGNOSIS — I639 Cerebral infarction, unspecified: Secondary | ICD-10-CM | POA: Diagnosis not present

## 2016-04-24 DIAGNOSIS — N6489 Other specified disorders of breast: Secondary | ICD-10-CM | POA: Diagnosis not present

## 2016-04-24 DIAGNOSIS — Z86000 Personal history of in-situ neoplasm of breast: Secondary | ICD-10-CM | POA: Diagnosis not present

## 2016-09-11 DIAGNOSIS — D696 Thrombocytopenia, unspecified: Secondary | ICD-10-CM | POA: Diagnosis not present

## 2016-09-11 DIAGNOSIS — D649 Anemia, unspecified: Secondary | ICD-10-CM | POA: Diagnosis not present

## 2016-09-11 DIAGNOSIS — D801 Nonfamilial hypogammaglobulinemia: Secondary | ICD-10-CM | POA: Diagnosis not present

## 2016-09-11 DIAGNOSIS — C9110 Chronic lymphocytic leukemia of B-cell type not having achieved remission: Secondary | ICD-10-CM | POA: Diagnosis not present

## 2016-11-10 DIAGNOSIS — N6489 Other specified disorders of breast: Secondary | ICD-10-CM | POA: Diagnosis not present

## 2016-11-10 DIAGNOSIS — I10 Essential (primary) hypertension: Secondary | ICD-10-CM | POA: Diagnosis not present

## 2016-11-10 DIAGNOSIS — Z86000 Personal history of in-situ neoplasm of breast: Secondary | ICD-10-CM | POA: Diagnosis not present

## 2016-11-10 DIAGNOSIS — M549 Dorsalgia, unspecified: Secondary | ICD-10-CM | POA: Diagnosis not present

## 2017-01-11 DIAGNOSIS — I6789 Other cerebrovascular disease: Secondary | ICD-10-CM | POA: Diagnosis not present

## 2017-01-11 DIAGNOSIS — M6281 Muscle weakness (generalized): Secondary | ICD-10-CM | POA: Diagnosis not present

## 2017-01-11 DIAGNOSIS — G459 Transient cerebral ischemic attack, unspecified: Secondary | ICD-10-CM | POA: Diagnosis not present

## 2017-01-11 DIAGNOSIS — K219 Gastro-esophageal reflux disease without esophagitis: Secondary | ICD-10-CM | POA: Diagnosis not present

## 2017-01-11 DIAGNOSIS — F418 Other specified anxiety disorders: Secondary | ICD-10-CM | POA: Diagnosis not present

## 2017-01-11 DIAGNOSIS — I10 Essential (primary) hypertension: Secondary | ICD-10-CM | POA: Diagnosis not present

## 2017-01-11 DIAGNOSIS — I6930 Unspecified sequelae of cerebral infarction: Secondary | ICD-10-CM | POA: Diagnosis not present

## 2017-01-11 DIAGNOSIS — E785 Hyperlipidemia, unspecified: Secondary | ICD-10-CM | POA: Diagnosis not present

## 2017-04-26 DIAGNOSIS — I10 Essential (primary) hypertension: Secondary | ICD-10-CM | POA: Diagnosis not present

## 2017-04-26 DIAGNOSIS — G4089 Other seizures: Secondary | ICD-10-CM

## 2017-04-27 DIAGNOSIS — I10 Essential (primary) hypertension: Secondary | ICD-10-CM | POA: Diagnosis not present

## 2017-04-27 DIAGNOSIS — G4089 Other seizures: Secondary | ICD-10-CM | POA: Diagnosis not present

## 2017-05-07 DIAGNOSIS — B372 Candidiasis of skin and nail: Secondary | ICD-10-CM | POA: Diagnosis not present

## 2017-05-07 DIAGNOSIS — Z86000 Personal history of in-situ neoplasm of breast: Secondary | ICD-10-CM | POA: Diagnosis not present

## 2017-05-07 DIAGNOSIS — N6489 Other specified disorders of breast: Secondary | ICD-10-CM | POA: Diagnosis not present

## 2018-06-23 DIAGNOSIS — Z86000 Personal history of in-situ neoplasm of breast: Secondary | ICD-10-CM | POA: Diagnosis not present

## 2019-11-11 ENCOUNTER — Encounter (HOSPITAL_COMMUNITY): Payer: Self-pay

## 2019-11-11 ENCOUNTER — Other Ambulatory Visit: Payer: Self-pay

## 2019-11-11 ENCOUNTER — Observation Stay (HOSPITAL_COMMUNITY): Payer: Medicare (Managed Care)

## 2019-11-11 ENCOUNTER — Inpatient Hospital Stay (HOSPITAL_COMMUNITY)
Admission: EM | Admit: 2019-11-11 | Discharge: 2019-11-15 | DRG: 305 | Disposition: A | Discharge status: Skilled Nursing Facility | Payer: Medicare (Managed Care) | Attending: Internal Medicine | Admitting: Internal Medicine

## 2019-11-11 DIAGNOSIS — Z8673 Personal history of transient ischemic attack (TIA), and cerebral infarction without residual deficits: Secondary | ICD-10-CM

## 2019-11-11 DIAGNOSIS — R8290 Unspecified abnormal findings in urine: Secondary | ICD-10-CM | POA: Diagnosis present

## 2019-11-11 DIAGNOSIS — R531 Weakness: Secondary | ICD-10-CM | POA: Diagnosis not present

## 2019-11-11 DIAGNOSIS — I2510 Atherosclerotic heart disease of native coronary artery without angina pectoris: Secondary | ICD-10-CM

## 2019-11-11 DIAGNOSIS — F0390 Unspecified dementia without behavioral disturbance: Secondary | ICD-10-CM

## 2019-11-11 DIAGNOSIS — I160 Hypertensive urgency: Secondary | ICD-10-CM | POA: Diagnosis not present

## 2019-11-11 DIAGNOSIS — R0902 Hypoxemia: Secondary | ICD-10-CM

## 2019-11-11 DIAGNOSIS — Z79899 Other long term (current) drug therapy: Secondary | ICD-10-CM

## 2019-11-11 DIAGNOSIS — L89312 Pressure ulcer of right buttock, stage 2: Secondary | ICD-10-CM | POA: Diagnosis present

## 2019-11-11 DIAGNOSIS — I252 Old myocardial infarction: Secondary | ICD-10-CM

## 2019-11-11 DIAGNOSIS — F418 Other specified anxiety disorders: Secondary | ICD-10-CM | POA: Diagnosis not present

## 2019-11-11 DIAGNOSIS — Z853 Personal history of malignant neoplasm of breast: Secondary | ICD-10-CM

## 2019-11-11 DIAGNOSIS — R627 Adult failure to thrive: Secondary | ICD-10-CM | POA: Diagnosis present

## 2019-11-11 DIAGNOSIS — Z91040 Latex allergy status: Secondary | ICD-10-CM

## 2019-11-11 DIAGNOSIS — Z7951 Long term (current) use of inhaled steroids: Secondary | ICD-10-CM

## 2019-11-11 DIAGNOSIS — Z20822 Contact with and (suspected) exposure to covid-19: Secondary | ICD-10-CM | POA: Diagnosis present

## 2019-11-11 DIAGNOSIS — I69354 Hemiplegia and hemiparesis following cerebral infarction affecting left non-dominant side: Secondary | ICD-10-CM

## 2019-11-11 DIAGNOSIS — I10 Essential (primary) hypertension: Secondary | ICD-10-CM | POA: Diagnosis present

## 2019-11-11 DIAGNOSIS — Z79890 Hormone replacement therapy (postmenopausal): Secondary | ICD-10-CM

## 2019-11-11 DIAGNOSIS — E039 Hypothyroidism, unspecified: Secondary | ICD-10-CM

## 2019-11-11 DIAGNOSIS — Z7901 Long term (current) use of anticoagulants: Secondary | ICD-10-CM

## 2019-11-11 DIAGNOSIS — L8990 Pressure ulcer of unspecified site, unspecified stage: Secondary | ICD-10-CM | POA: Insufficient documentation

## 2019-11-11 HISTORY — DX: Cerebral infarction, unspecified: I63.9

## 2019-11-11 HISTORY — DX: Malignant neoplasm of unspecified site of unspecified female breast: C50.919

## 2019-11-11 HISTORY — DX: Unspecified dementia, unspecified severity, without behavioral disturbance, psychotic disturbance, mood disturbance, and anxiety: F03.90

## 2019-11-11 HISTORY — DX: Acute myocardial infarction, unspecified: I21.9

## 2019-11-11 LAB — URINALYSIS, ROUTINE W REFLEX MICROSCOPIC
Bilirubin Urine: NEGATIVE
Glucose, UA: NEGATIVE mg/dL
Ketones, ur: NEGATIVE mg/dL
Nitrite: NEGATIVE
Protein, ur: NEGATIVE mg/dL
Specific Gravity, Urine: 1.016 (ref 1.005–1.030)
pH: 5 (ref 5.0–8.0)

## 2019-11-11 LAB — CBC
HCT: 43.8 % (ref 36.0–46.0)
Hemoglobin: 14.1 g/dL (ref 12.0–15.0)
MCH: 30 pg (ref 26.0–34.0)
MCHC: 32.2 g/dL (ref 30.0–36.0)
MCV: 93.2 fL (ref 80.0–100.0)
Platelets: 173 10*3/uL (ref 150–400)
RBC: 4.7 MIL/uL (ref 3.87–5.11)
RDW: 13.9 % (ref 11.5–15.5)
WBC: 7 10*3/uL (ref 4.0–10.5)
nRBC: 0 % (ref 0.0–0.2)

## 2019-11-11 LAB — COMPREHENSIVE METABOLIC PANEL
ALT: 8 U/L (ref 0–44)
AST: 15 U/L (ref 15–41)
Albumin: 3.4 g/dL — ABNORMAL LOW (ref 3.5–5.0)
Alkaline Phosphatase: 40 U/L (ref 38–126)
Anion gap: 9 (ref 5–15)
BUN: 18 mg/dL (ref 8–23)
CO2: 25 mmol/L (ref 22–32)
Calcium: 9.2 mg/dL (ref 8.9–10.3)
Chloride: 103 mmol/L (ref 98–111)
Creatinine, Ser: 0.96 mg/dL (ref 0.44–1.00)
GFR calc Af Amer: >60 mL/min (ref >60)
GFR calc non Af Amer: 60 mL/min — ABNORMAL LOW (ref >60)
Glucose, Bld: 98 mg/dL (ref 70–99)
Potassium: 4.8 mmol/L (ref 3.5–5.1)
Sodium: 137 mmol/L (ref 135–145)
Total Bilirubin: 0.7 mg/dL (ref 0.3–1.2)
Total Protein: 7.2 g/dL (ref 6.5–8.1)

## 2019-11-11 LAB — TSH: TSH: 1.45 u[IU]/mL (ref 0.350–4.500)

## 2019-11-11 MED ORDER — SODIUM CHLORIDE 0.9 % IV SOLN
1.0000 g | Freq: Once | INTRAVENOUS | Status: AC
  Administered 2019-11-11: 1 g via INTRAVENOUS
  Filled 2019-11-11: qty 10

## 2019-11-11 MED ORDER — SODIUM CHLORIDE 0.9 % IV BOLUS
500.0000 mL | Freq: Once | INTRAVENOUS | Status: AC
  Administered 2019-11-11: 500 mL via INTRAVENOUS

## 2019-11-11 NOTE — ED Notes (Signed)
x2 unsuccessful attempts to get IV and bloodwork

## 2019-11-11 NOTE — ED Notes (Signed)
MD made aware that pt has been stuck 6x without successful blood draw

## 2019-11-11 NOTE — ED Provider Notes (Signed)
[facility] DEPT Provider Note   CSN: [id number] Arrival date & time: 11/11/19  1805     History Chief Complaint  Patient presents with  . Weakness    Leslie Lin is a 69 y.o. female.  Patient with hx dementia, arrives via EMS from home. Per report, pt was seen at [facility] ED yesterday for same, given ivf, and possible plan for admission, but pt/signif other decided to go home. Today returns w report that significant other notes increased difficulty caring for at home. Pt is awake, alert, confused/dementia, with no insight into why in ED today, denies specific c/o - level 5 caveat.   Patients significant other arrives, states in past 4 months, progressive dementia, and that in past 2 weeks, too weak to walk, even w walker, not eating/drinking, gen weakness, failing to thrive. Has home health/aide, but with that, is struggling to manage. States recent uti, completing abx last week.   The history is provided by the patient and the EMS personnel. The history is limited by the condition of the patient.  Weakness Associated symptoms: no abdominal pain, no chest pain, no dysuria, no fever, no headaches and no shortness of breath        Past Medical History:  Diagnosis Date  . Breast cancer ([place])   . CVA (cerebral vascular accident) ([place])   . Dementia ([place])   . MI (myocardial infarction) ([place])    x3    There are no problems to display for this patient.   History reviewed. No pertinent surgical history.   OB History   No obstetric history on file.     No family history on file.  Social History   Tobacco Use  . Smoking status: Not on file  Substance Use Topics  . Alcohol use: Not on file  . Drug use: Not on file    Home Medications Prior to Admission medications   Not on File    Allergies    Latex  Review of Systems   Review of Systems  Unable to perform ROS: Dementia  Constitutional: Negative for fever.  HENT:  Negative for sore throat.   Eyes: Negative for redness.  Respiratory: Negative for shortness of breath.   Cardiovascular: Negative for chest pain.  Gastrointestinal: Negative for abdominal pain.  Genitourinary: Negative for dysuria.  Musculoskeletal: Negative for back pain and neck pain.  Skin: Negative for rash.  Neurological: Positive for weakness. Negative for headaches.  Psychiatric/Behavioral: Positive for confusion.  level 5 caveat  - dementia  Physical Exam Updated Vital Signs There were no vitals taken for this visit.  Physical Exam Vitals and nursing note reviewed.  Constitutional:      Appearance: Normal appearance. She is well-developed.     Comments: Generally weak/frail appearing.   HENT:     Head: Atraumatic.     Nose: Nose normal.     Mouth/Throat:     Mouth: Mucous membranes are moist.  Eyes:     General: No scleral icterus.    Conjunctiva/sclera: Conjunctivae normal.     Pupils: Pupils are equal, round, and reactive to light.  Neck:     Vascular: No carotid bruit.     Trachea: No tracheal deviation.  Cardiovascular:     Rate and Rhythm: Normal rate and regular rhythm.     Pulses: Normal pulses.     Heart sounds: Normal heart sounds. No murmur heard.  No friction rub. No gallop.   Pulmonary:  Effort: Pulmonary effort is normal. No respiratory distress.     Breath sounds: Normal breath sounds.  Abdominal:     General: Bowel sounds are normal. There is no distension.     Palpations: Abdomen is soft.     Tenderness: There is no abdominal tenderness. There is no guarding.  Genitourinary:    Comments: No cva tenderness.  Musculoskeletal:        General: No swelling or tenderness.     Cervical back: Normal range of motion and neck supple. No rigidity. No muscular tenderness.  Skin:    General: Skin is warm and dry.     Findings: No rash.  Neurological:     Mental Status: She is alert.     Comments: Alert, speech normal. Moves bil extremities  purposefully w good strength. No focal/unilateraly weakness.   Psychiatric:        Mood and Affect: Mood normal.     ED Results / Procedures / Treatments   Labs (all labs ordered are listed, but only abnormal results are displayed) Results for orders placed or performed during the hospital encounter of 11/11/19  Comprehensive metabolic panel  Result Value Ref Range   Sodium 137 135 - 145 mmol/L   Potassium 4.8 3.5 - 5.1 mmol/L   Chloride 103 98 - 111 mmol/L   CO2 25 22 - 32 mmol/L   Glucose, Bld 98 70 - 99 mg/dL   BUN 18 8 - 23 mg/dL   Creatinine, Ser 0.96 0.44 - 1.00 mg/dL   Calcium 9.2 8.9 - 10.3 mg/dL   Total Protein 7.2 6.5 - 8.1 g/dL   Albumin 3.4 (L) 3.5 - 5.0 g/dL   AST 15 15 - 41 U/L   ALT 8 0 - 44 U/L   Alkaline Phosphatase 40 38 - 126 U/L   Total Bilirubin 0.7 0.3 - 1.2 mg/dL   GFR calc non Af Amer 60 (L) >60 mL/min   GFR calc Af Amer >60 >60 mL/min   Anion gap 9 5 - 15  CBC  Result Value Ref Range   WBC 7.0 4.0 - 10.5 K/uL   RBC 4.70 3.87 - 5.11 MIL/uL   Hemoglobin 14.1 12.0 - 15.0 g/dL   HCT 43.8 36 - 46 %   MCV 93.2 80.0 - 100.0 fL   MCH 30.0 26.0 - 34.0 pg   MCHC 32.2 30.0 - 36.0 g/dL   RDW 13.9 11.5 - 15.5 %   Platelets 173 150 - 400 K/uL   nRBC 0.0 0.0 - 0.2 %  Urinalysis, Routine w reflex microscopic  Result Value Ref Range   Color, Urine YELLOW YELLOW   APPearance CLEAR CLEAR   Specific Gravity, Urine 1.016 1.005 - 1.030   pH 5.0 5.0 - 8.0   Glucose, UA NEGATIVE NEGATIVE mg/dL   Hgb urine dipstick MODERATE (A) NEGATIVE   Bilirubin Urine NEGATIVE NEGATIVE   Ketones, ur NEGATIVE NEGATIVE mg/dL   Protein, ur NEGATIVE NEGATIVE mg/dL   Nitrite NEGATIVE NEGATIVE   Leukocytes,Ua MODERATE (A) NEGATIVE   RBC / HPF 11-20 0 - 5 RBC/hpf   WBC, UA 11-20 0 - 5 WBC/hpf   Bacteria, UA RARE (A) NONE SEEN   Squamous Epithelial / LPF 0-5 0 - 5   Mucus PRESENT    Hyaline Casts, UA PRESENT   TSH  Result Value Ref Range   TSH 1.450 0.350 - 4.500 uIU/mL     EKG None  Radiology No results found.  Procedures Procedures (including critical  care time)  Medications Ordered in ED Medications - No data to display  ED Course  I have reviewed the triage vital signs and the nursing notes.  Pertinent labs & imaging results that were available during my care of the patient were reviewed by me and considered in my medical decision making (see chart for details).    MDM Rules/Calculators/A&P                         Labs sent.   Reviewed nursing notes and prior charts for additional history.   Asked to get recent lab/imaging results faxed from [place] reviewed/interpreted by me - chem normal. Wbc 7. Await ua.  UA c/w uti with LE +, 11-20 wbc. Will culture and rx.   Given progressive weakness, recurrent uti, poor po intake, etc, signif other requests admission, and also notes patient may need ECF post hospitalization.  Will consult hospitalists for admission.     Final Clinical Impression(s) / ED Diagnoses Final diagnoses:  None    Rx / DC Orders ED Discharge Orders    None       Lajean Saver, MD 11/11/19 2208

## 2019-11-11 NOTE — ED Triage Notes (Addendum)
Pt BIB EMS from home. Pt has hx of dementia. Pts husband reports increased lethargy x1 week. Pt was seen at [facility] yesterday and husband refused pt admission. Pts husband reports that he cannot take care of her at home anymore.   130/70 HR 82 RR 18 SpO2 87% RA and placed on 2L CBG 97

## 2019-11-11 NOTE — ED Notes (Signed)
Unsuccessful attempt to draw labs x2

## 2019-11-11 NOTE — ED Notes (Signed)
Unsuccessful lab collection attempt x2. IV access obtained.

## 2019-11-11 NOTE — H&P (Signed)
History and Physical    Leslie Lin ERD:[id number] DOB: 04-17-1951 DOA: 11/11/2019  PCP: Amado Nash, MD   Patient coming from: Home   Chief Complaint: Generalized weakness   HPI: Leslie Lin is a 69 y.o. female with medical history significant for dementia, coronary artery disease, history of CVA, depression, anxiety, and hypothyroidism, now presenting to the emergency department for evaluation of general weakness and lethargy.  She is accompanied by her significant other who is also her POA.  Patient has had slowly progressive general weakness and cognitive decline over the past several months, is no longer able to ambulate with a walker due to this, has been cared for at home by her significant other, and he reports being unable to get her out of bed in the past couple weeks due to her progressive general weakness and lethargy.  He could previously get her up with a Harrel Lemon lift.  The patient has not voiced any specific complaints other than some low back pain which is localized to the central low back, worse with lying in the same position for prolonged periods, and without any alleviating factors identified.  There are has not been any fevers documented and the patient denies any chills.  She denies shortness of breath, cough, chest discomfort, dysuria, or flank pain.  No recent seizure-like activity has been observed and she has not had any difficulty swallowing or speaking.  Patient reports that her appetite is good but her significant other feels that she has not been eating or drinking enough recently.  She was apparently seen for similar complaints at [facility] emergency department on 11/10/2019.  ED Course: Upon arrival to the ED, patient is found to be afebrile, saturating low to mid 90s on room air, and with normal respiratory rate, normal heart rate, and stable blood pressure.  Chest x-ray is negative for acute cardiopulmonary disease.  CMP and CBC are unremarkable.  TSH is  normal.  Urinalysis with moderate hemoglobin and moderate leukocyte esterase.  Urine was sent for culture, patient was given 500 cc of normal saline, and 1 g IV Rocephin.  COVID-19 screening test is in process.  Hospitalists were consulted for admission.  Review of Systems:  All other systems reviewed and apart from HPI, are negative.  Past Medical History:  Diagnosis Date  . Breast cancer ([place])   . CVA (cerebral vascular accident) ([place])   . Dementia ([place])   . MI (myocardial infarction) ([place])    x3    History reviewed. No pertinent surgical history.  Social History:   has no history on file for tobacco use, alcohol use, and drug use.  Allergies  Allergen Reactions  . Latex Hives    History reviewed. No pertinent family history.   Prior to Admission medications   Medication Sig Start Date End Date Taking? Authorizing Provider  apixaban (ELIQUIS) 5 MG TABS tablet Take 5 mg by mouth 2 (two) times daily.   Yes [provider]  bisoprolol (ZEBETA) 10 MG tablet Take 10 mg by mouth daily.   Yes [provider]  calcium-vitamin D (OSCAL WITH D) 250-125 MG-UNIT tablet Take 1 tablet by mouth daily.   Yes [provider]  divalproex (DEPAKOTE) 500 MG DR tablet Take 500 mg by mouth 2 (two) times daily.   Yes [provider]  fluticasone furoate-vilanterol (BREO ELLIPTA) 200-25 MCG/INH AEPB Inhale 1 puff into the lungs daily.   Yes [provider]  levETIRAcetam (KEPPRA) 500 MG tablet Take 500 mg  by mouth 2 (two) times daily.   Yes [provider]  levothyroxine (SYNTHROID) 50 MCG tablet Take 50 mcg by mouth daily before breakfast.   Yes [provider]  lisinopril (ZESTRIL) 40 MG tablet Take 40 mg by mouth daily.   Yes [provider]  Probiotic Product (ALIGN) 4 MG CAPS Take 4 mg by mouth daily.   Yes [provider]  rosuvastatin (CRESTOR) 5 MG tablet Take 5 mg by mouth daily.   Yes [provider]    traZODone (DESYREL) 150 MG tablet Take 150 mg by mouth at bedtime.   Yes [provider]  Venlafaxine HCl 150 MG TB24 Take 150 mg by mouth daily.   Yes [provider]    Physical Exam: Vitals:   11/11/19 1922 11/11/19 2101 11/11/19 2130 11/11/19 2238  BP:  (!) 142/100 (!) 133/97 (!) 151/104  Pulse:  61 63 64  Resp:  18 16 18   Temp: 99 F (37.2 C)     TempSrc: Rectal     SpO2:  98% 96% 95%    Constitutional: NAD, calm  Eyes: PERTLA, lids and conjunctivae normal ENMT: Mucous membranes are moist. Posterior pharynx clear of any exudate or lesions.   Neck: normal, supple, no masses, no thyromegaly Respiratory: clear to auscultation bilaterally, no wheezing, no crackles. No accessory muscle use.  Cardiovascular: S1 & S2 heard, regular rate and rhythm. No extremity edema. No significant JVD. Abdomen: No distension, no tenderness, soft. Bowel sounds active.  Musculoskeletal: no clubbing / cyanosis. No joint deformity upper and lower extremities.   Skin: no significant rashes, lesions, ulcers. Warm, dry, well-perfused. Neurologic: CN 2-12 grossly intact. No dysarthria or aphasia. Resting tremor. Sensation to light touch intact. Moving all extremities.  Psychiatric: Alert and oriented to person, place, and situation. Calm and cooperative.    Labs and Imaging on Admission: I have personally reviewed following labs and imaging studies  CBC: Recent Labs  Lab 11/11/19 2015  WBC 7.0  HGB 14.1  HCT 43.8  MCV 93.2  PLT 595   Basic Metabolic Panel: Recent Labs  Lab 11/11/19 2015  NA 137  K 4.8  CL 103  CO2 25  GLUCOSE 98  BUN 18  CREATININE 0.96  CALCIUM 9.2   GFR: CrCl cannot be calculated (Unknown ideal weight.). Liver Function Tests: Recent Labs  Lab 11/11/19 2015  AST 15  ALT 8  ALKPHOS 40  BILITOT 0.7  PROT 7.2  ALBUMIN 3.4*   No results for input(s): LIPASE, AMYLASE in the last 168 hours. No results for input(s): AMMONIA in the last 168  hours. Coagulation Profile: No results for input(s): INR, PROTIME in the last 168 hours. Cardiac Enzymes: No results for input(s): CKTOTAL, CKMB, CKMBINDEX, TROPONINI in the last 168 hours. BNP (last 3 results) No results for input(s): PROBNP in the last 8760 hours. HbA1C: No results for input(s): HGBA1C in the last 72 hours. CBG: No results for input(s): GLUCAP in the last 168 hours. Lipid Profile: No results for input(s): CHOL, HDL, LDLCALC, TRIG, CHOLHDL, LDLDIRECT in the last 72 hours. Thyroid Function Tests: Recent Labs    11/11/19 2015  TSH 1.450   Anemia Panel: No results for input(s): VITAMINB12, FOLATE, FERRITIN, TIBC, IRON, RETICCTPCT in the last 72 hours. Urine analysis:    Component Value Date/Time   COLORURINE YELLOW 11/11/2019 1830   APPEARANCEUR CLEAR 11/11/2019 1830   LABSPEC 1.016 11/11/2019 1830   PHURINE 5.0 11/11/2019 [place] 11/11/2019 1830  HGBUR MODERATE (A) 11/11/2019 1830   BILIRUBINUR NEGATIVE 11/11/2019 1830   KETONESUR NEGATIVE 11/11/2019 1830   PROTEINUR NEGATIVE 11/11/2019 1830   NITRITE NEGATIVE 11/11/2019 1830   LEUKOCYTESUR MODERATE (A) 11/11/2019 1830   Sepsis Labs: @LABRCNTIP (procalcitonin:4,lacticidven:4) )No results found for this or any previous visit (from the past 240 hour(s)).   Radiological Exams on Admission: DG CHEST PORT 1 VIEW  Result Date: 11/11/2019 CLINICAL DATA:  Lethargy EXAM: PORTABLE CHEST 1 VIEW COMPARISON:  November 10, 2019 FINDINGS: The heart size and mediastinal contours are within normal limits. Aortic knob calcifications. Both lungs are clear. The visualized skeletal structures are unremarkable. IMPRESSION: No active disease. Electronically Signed   By: Prudencio Pair M.D.   On: 11/11/2019 23:13    Assessment/Plan   1. Generalized weakness  - Patient has reported hx of dementia, has had progressive general weakness over the past several months and her significant other has not been able to get her  out of bed in the past 2 weeks due to this  - TSH was normal in ED, there is no infectious s/s, no chest discomfort or new focal neuro deficit, and her basic labs are unremarkable  - Check CK and valproate level, obtain outside medical records, consult with PT and TOC, continue supportive care    2. Depression, anxiety  - Continue venlafaxine, trazodone    3. Hypothyroidism  - TSH normal in ED, continue Synthroid    4. History of CVA  - Patient denies any acute focal numbness or weakness but has progressive general weakness over the course of months  - Continue statin and Eliquis    5. CAD  - No anginal complaints, continue statin, beta-blocker, and ACE-i  6. Abnormal UA  - There was concern for UTI in ED, urine was cultured, and she was treated with Rocephin but there is no fever, no leukocytosis, and the patient denies any dysuria, abdominal pain, or flank pain    DVT prophylaxis: Eliquis  Code Status: Full, confirmed with patient and POA on admission   Family Communication: Red Christians, her significant other and POA was updated at bedside  Disposition Plan:  Patient is from: Home  Anticipated d/c is to: SNF  Anticipated d/c date is: 11/13/19 Patient currently: Has progressive generalized weakness, no longer able to get out of bed, and is pending PT eval  Consults called: None  Admission status: Observation     Vianne Bulls, MD [facility]  11/12/2019, 12:19 AM

## 2019-11-12 DIAGNOSIS — R531 Weakness: Secondary | ICD-10-CM | POA: Diagnosis not present

## 2019-11-12 LAB — CBC
HCT: 42.9 % (ref 36.0–46.0)
Hemoglobin: 13.8 g/dL (ref 12.0–15.0)
MCH: 29.7 pg (ref 26.0–34.0)
MCHC: 32.2 g/dL (ref 30.0–36.0)
MCV: 92.5 fL (ref 80.0–100.0)
Platelets: 183 10*3/uL (ref 150–400)
RBC: 4.64 MIL/uL (ref 3.87–5.11)
RDW: 13.6 % (ref 11.5–15.5)
WBC: 7.6 10*3/uL (ref 4.0–10.5)
nRBC: 0 % (ref 0.0–0.2)

## 2019-11-12 LAB — SARS CORONAVIRUS 2 BY RT PCR (HOSPITAL ORDER, PERFORMED IN ~~LOC~~ HOSPITAL LAB): SARS Coronavirus 2: NEGATIVE

## 2019-11-12 LAB — BASIC METABOLIC PANEL
Anion gap: 10 (ref 5–15)
BUN: 19 mg/dL (ref 8–23)
CO2: 27 mmol/L (ref 22–32)
Calcium: 9.4 mg/dL (ref 8.9–10.3)
Chloride: 106 mmol/L (ref 98–111)
Creatinine, Ser: 0.93 mg/dL (ref 0.44–1.00)
GFR calc Af Amer: >60 mL/min (ref >60)
GFR calc non Af Amer: >60 mL/min (ref >60)
Glucose, Bld: 99 mg/dL (ref 70–99)
Potassium: 4.5 mmol/L (ref 3.5–5.1)
Sodium: 143 mmol/L (ref 135–145)

## 2019-11-12 LAB — VITAMIN B12: Vitamin B-12: 419 pg/mL (ref 180–914)

## 2019-11-12 LAB — CK: Total CK: 143 U/L (ref 38–234)

## 2019-11-12 LAB — FOLATE: Folate: 9.3 ng/mL (ref >5.9)

## 2019-11-12 LAB — VALPROIC ACID LEVEL: Valproic Acid Lvl: 32 ug/mL — ABNORMAL LOW (ref 50.0–100.0)

## 2019-11-12 LAB — HIV ANTIBODY (ROUTINE TESTING W REFLEX): HIV Screen 4th Generation wRfx: NONREACTIVE

## 2019-11-12 MED ORDER — ROSUVASTATIN CALCIUM 5 MG PO TABS
5.0000 mg | ORAL_TABLET | Freq: Every day | ORAL | Status: DC
  Administered 2019-11-12 – 2019-11-15 (×4): 5 mg via ORAL
  Filled 2019-11-12 (×4): qty 1

## 2019-11-12 MED ORDER — ONDANSETRON HCL 4 MG PO TABS: 4.0000 mg | ORAL_TABLET | Freq: Four times a day (QID) | ORAL | Status: DC | PRN

## 2019-11-12 MED ORDER — SODIUM CHLORIDE 0.9 % IV SOLN: 250.0000 mL | INTRAVENOUS | Status: DC | PRN

## 2019-11-12 MED ORDER — SODIUM CHLORIDE 0.9% FLUSH: 3.0000 mL | INTRAVENOUS | Status: DC | PRN

## 2019-11-12 MED ORDER — ACETAMINOPHEN 325 MG PO TABS
650.0000 mg | ORAL_TABLET | Freq: Four times a day (QID) | ORAL | Status: DC | PRN
  Administered 2019-11-13: 650 mg via ORAL
  Filled 2019-11-12: qty 2

## 2019-11-12 MED ORDER — BISOPROLOL FUMARATE 5 MG PO TABS
10.0000 mg | ORAL_TABLET | Freq: Every day | ORAL | Status: DC
  Administered 2019-11-12 – 2019-11-14 (×3): 10 mg via ORAL
  Filled 2019-11-12 (×4): qty 2

## 2019-11-12 MED ORDER — LEVETIRACETAM 500 MG PO TABS
500.0000 mg | ORAL_TABLET | Freq: Two times a day (BID) | ORAL | Status: DC
  Administered 2019-11-12 – 2019-11-15 (×7): 500 mg via ORAL
  Filled 2019-11-12 (×8): qty 1

## 2019-11-12 MED ORDER — DIVALPROEX SODIUM 250 MG PO DR TAB
500.0000 mg | DELAYED_RELEASE_TABLET | Freq: Two times a day (BID) | ORAL | Status: DC
  Administered 2019-11-12 – 2019-11-15 (×7): 500 mg via ORAL
  Filled 2019-11-12 (×8): qty 2

## 2019-11-12 MED ORDER — SODIUM CHLORIDE 0.9% FLUSH
3.0000 mL | Freq: Two times a day (BID) | INTRAVENOUS | Status: DC
  Administered 2019-11-12 – 2019-11-15 (×8): 3 mL via INTRAVENOUS

## 2019-11-12 MED ORDER — APIXABAN 5 MG PO TABS
5.0000 mg | ORAL_TABLET | Freq: Two times a day (BID) | ORAL | Status: DC
  Administered 2019-11-12 – 2019-11-15 (×7): 5 mg via ORAL
  Filled 2019-11-12 (×8): qty 1

## 2019-11-12 MED ORDER — ACETAMINOPHEN 650 MG RE SUPP: 650.0000 mg | Freq: Four times a day (QID) | RECTAL | Status: DC | PRN

## 2019-11-12 MED ORDER — SENNOSIDES-DOCUSATE SODIUM 8.6-50 MG PO TABS
1.0000 | ORAL_TABLET | Freq: Every evening | ORAL | Status: DC | PRN
  Administered 2019-11-12 – 2019-11-15 (×2): 1 via ORAL
  Filled 2019-11-12 (×2): qty 1

## 2019-11-12 MED ORDER — FLUTICASONE FUROATE-VILANTEROL 200-25 MCG/INH IN AEPB
1.0000 | INHALATION_SPRAY | Freq: Every day | RESPIRATORY_TRACT | Status: DC
  Administered 2019-11-12 – 2019-11-15 (×4): 1 via RESPIRATORY_TRACT
  Filled 2019-11-12: qty 28

## 2019-11-12 MED ORDER — ONDANSETRON HCL 4 MG/2ML IJ SOLN
4.0000 mg | Freq: Four times a day (QID) | INTRAMUSCULAR | Status: DC | PRN
  Administered 2019-11-12: 4 mg via INTRAVENOUS
  Filled 2019-11-12: qty 2

## 2019-11-12 MED ORDER — HYDROCODONE-ACETAMINOPHEN 5-325 MG PO TABS
1.0000 | ORAL_TABLET | Freq: Three times a day (TID) | ORAL | Status: DC | PRN
  Administered 2019-11-12 – 2019-11-14 (×3): 1 via ORAL
  Filled 2019-11-12 (×3): qty 1

## 2019-11-12 MED ORDER — HYDRALAZINE HCL 25 MG PO TABS
25.0000 mg | ORAL_TABLET | Freq: Four times a day (QID) | ORAL | Status: DC | PRN
  Administered 2019-11-14: 25 mg via ORAL
  Filled 2019-11-12: qty 1

## 2019-11-12 MED ORDER — VENLAFAXINE HCL ER 150 MG PO CP24
150.0000 mg | ORAL_CAPSULE | Freq: Every day | ORAL | Status: DC
  Administered 2019-11-12 – 2019-11-15 (×4): 150 mg via ORAL
  Filled 2019-11-12 (×4): qty 1

## 2019-11-12 MED ORDER — LISINOPRIL 20 MG PO TABS: 40.0000 mg | ORAL_TABLET | Freq: Every day | ORAL | Status: DC

## 2019-11-12 MED ORDER — LEVOTHYROXINE SODIUM 50 MCG PO TABS
50.0000 ug | ORAL_TABLET | Freq: Every day | ORAL | Status: DC
  Administered 2019-11-12 – 2019-11-15 (×4): 50 ug via ORAL
  Filled 2019-11-12 (×4): qty 1

## 2019-11-12 MED ORDER — LISINOPRIL 20 MG PO TABS
40.0000 mg | ORAL_TABLET | Freq: Every day | ORAL | Status: DC
  Administered 2019-11-12 – 2019-11-14 (×3): 40 mg via ORAL
  Filled 2019-11-12 (×4): qty 2

## 2019-11-12 MED ORDER — TRAZODONE HCL 50 MG PO TABS
150.0000 mg | ORAL_TABLET | Freq: Every day | ORAL | Status: DC
  Administered 2019-11-12 – 2019-11-14 (×4): 150 mg via ORAL
  Filled 2019-11-12 (×4): qty 3

## 2019-11-12 NOTE — Evaluation (Signed)
Physical Therapy Evaluation Patient Details Name: Leslie Lin MRN: [id number] DOB: 1951/01/04 Today's Date: 11/12/2019   History of Present Illness  Pt admitted 2* pt increased weakness/lethargy and spouse's inability to care for in current condition.  Pt with hx of dementia, CAD, CVA and vertigo  Clinical Impression  Pt admitted as above and presenting with functional mobility limitations 2* generalized weakness, balance deficits associated with vertigo and prior CVA, limited endurance, and dementia related cognitive deficits.  Pt currently requiring significant assist of 2 for performance of all basic mobility tasks and will benefit from follow up rehab at SNF level.    Follow Up Recommendations SNF    Equipment Recommendations  None recommended by PT    Recommendations for Other Services OT consult     Precautions / Restrictions Precautions Precautions: Fall Restrictions Weight Bearing Restrictions: No      Mobility  Bed Mobility Overal bed mobility: Needs Assistance Bed Mobility: Rolling;Sidelying to Sit Rolling: Min assist;Mod assist Sidelying to sit: Mod assist       General bed mobility comments: cues for sequence and physical assist to complete tasks  Transfers Overall transfer level: Needs assistance Equipment used: Rolling walker (2 wheeled) Transfers: Sit to/from [facility] Sit to Stand: Mod assist;+2 physical assistance;+2 safety/equipment;From elevated surface Stand pivot transfers: Mod assist;+2 physical assistance;+2 safety/equipment       General transfer comment: cues for use of UEs and for LE management; physical assist to bring wt up and fwd and to balance in standing.  Stand/pvt recliner<>BSC  Ambulation/Gait Ambulation/Gait assistance: Mod assist;+2 physical assistance;+2 safety/equipment Gait Distance (Feet): 6 Feet Assistive device: Rolling walker (2 wheeled) Gait Pattern/deviations: Step-to pattern;Decreased step length -  right;Decreased step length - left;Shuffle;Narrow base of support;Steppage Gait velocity: decr   General Gait Details: Assist for balance/support to correct for right/posteriordrift; pt with noted exaggerated step and decreased gross motor control of L LE - pt states since previous CVA  Stairs            Wheelchair Mobility    Modified Rankin (Stroke Patients Only)       Balance Overall balance assessment: Needs assistance Sitting-balance support: Bilateral upper extremity supported;Feet supported Sitting balance-Leahy Scale: Poor Sitting balance - Comments: near balance loss in multiple directions - pt states vertigo with move to sitting Postural control: Posterior lean;Right lateral lean;Left lateral lean Standing balance support: Bilateral upper extremity supported Standing balance-Leahy Scale: Poor Standing balance comment: posterior right lean                             Pertinent Vitals/Pain Pain Assessment: No/denies pain    Home Living Family/patient expects to be discharged to:: Unsure                      Prior Function Level of Independence: Needs assistance   Gait / Transfers Assistance Needed: Pt states largely uses WC and ambulation is very limited at this point     Comments: Per chart, pt recent condition progressed to spouse using hoyer lift to transfer pt     Hand Dominance        Extremity/Trunk Assessment   Upper Extremity Assessment Upper Extremity Assessment: Generalized weakness    Lower Extremity Assessment Lower Extremity Assessment: Generalized weakness;LLE deficits/detail LLE Coordination: decreased gross motor       Communication      Cognition Arousal/Alertness: Awake/alert Behavior During Therapy: Impulsive;Flat affect Overall Cognitive Status:  No family/caregiver present to determine baseline cognitive functioning                                        General Comments       Exercises     Assessment/Plan    PT Assessment Patient needs continued PT services  PT Problem List Decreased strength;Decreased activity tolerance;Decreased balance;Decreased mobility;Decreased cognition;Decreased knowledge of use of DME;Decreased safety awareness       PT Treatment Interventions DME instruction;Gait training;Therapeutic exercise;Functional mobility training;Therapeutic activities;Balance training;Patient/family education    PT Goals (Current goals can be found in the Care Plan section)  Acute Rehab PT Goals Patient Stated Goal: Use the bathroom PT Goal Formulation: With patient Time For Goal Achievement: 11/26/19 Potential to Achieve Goals: Fair    Frequency Min 2X/week   Barriers to discharge Decreased caregiver support Per chart, spouse unable to care for pt in current condition    Co-evaluation               AM-PAC PT "6 Clicks" Mobility  Outcome Measure Help needed turning from your back to your side while in a flat bed without using bedrails?: A Little Help needed moving from lying on your back to sitting on the side of a flat bed without using bedrails?: A Lot Help needed moving to and from a bed to a chair (including a wheelchair)?: A Lot Help needed standing up from a chair using your arms (e.g., wheelchair or bedside chair)?: A Lot Help needed to walk in hospital room?: A Lot Help needed climbing 3-5 steps with a railing? : Total 6 Click Score: 12    End of Session Equipment Utilized During Treatment: Gait belt Activity Tolerance: Patient limited by fatigue;Other (comment) (c/o dizziness - states has vertigo) Patient left: in chair;with call bell/phone within reach;with chair alarm set;with nursing/sitter in room Nurse Communication: Mobility status PT Visit Diagnosis: Unsteadiness on feet (R26.81);Muscle weakness (generalized) (M62.81);Difficulty in walking, not elsewhere classified (R26.2)    Time: 3832-9191 PT Time Calculation (min)  (ACUTE ONLY): 23 min   Charges:   PT Evaluation $PT Eval Moderate Complexity: 1 Mod PT Treatments $Therapeutic Activity: 8-22 mins        Debe Coder PT Acute Rehabilitation Services Pager [phone] Office [phone]   Joice Nazario 11/12/2019, 2:05 PM

## 2019-11-12 NOTE — Progress Notes (Addendum)
Leslie Lin is a poor historian and is unable to provided her H&P at this time.

## 2019-11-12 NOTE — Progress Notes (Signed)
PROGRESS NOTE    Leslie Lin  DGL:[id number] DOB: 04-15-1951 DOA: 11/11/2019 PCP: Amado Nash, MD   Chief Complaint  Patient presents with  . Weakness    Brief Narrative: As per HPI: 69 y.o. female with medical history significant for dementia, coronary artery disease, history of CVA, depression, anxiety, and hypothyroidism, now presenting to the emergency department for evaluation of general weakness and lethargy.  She is accompanied by her significant other who is also her POA.  Patient has had slowly progressive general weakness and cognitive decline over the past several months, is no longer able to ambulate with a walker due to this, has been cared for at home by her significant other, and he reports being unable to get her out of bed in the past couple weeks due to her progressive general weakness and lethargy.  He could previously get her up with a Harrel Lemon lift.  The patient has not voiced any specific complaints other than some low back pain which is localized to the central low back, worse with lying in the same position for prolonged periods, and without any alleviating factors identified.  There are has not been any fevers documented and the patient denies any chills.  She denies shortness of breath, cough, chest discomfort, dysuria, or flank pain.  No recent seizure-like activity has been observed and she has not had any difficulty swallowing or speaking.  Patient reports that her appetite is good but her significant other feels that she has not been eating or drinking enough recently.  She was apparently seen for similar complaints at [facility] emergency department on 11/10/2019.  ED Course: Upon arrival to the ED, patient is found to be afebrile, saturating low to mid 90s on room air, and with normal respiratory rate, normal heart rate, and stable blood pressure.  Chest x-ray is negative for acute cardiopulmonary disease.  CMP and CBC are unremarkable.  TSH is normal.   Urinalysis with moderate hemoglobin and moderate leukocyte esterase.  Urine was sent for culture, patient was given 500 cc of normal saline, and 1 g IV Rocephin.  COVID-19 screening test is in process.  Hospitalists were consulted for admission.  Subjective: Having meal- tech feeding her, alert,awake. Oriented ti self,dob, thinks she is at [facility] and her doctor supposed to see her. Blood pressure running high up to 212/137 during night, this am at 145/119 Afebrile.  Plan reviewed  Assessment & Plan:  General weakness: Progressive generalized weakness possible months in the setting of dementia, unable to get out of bed in the past 2 weeks with the help of significant other.  TSH normal no sign symptoms of infection overall labs stable.  CK 143, folate 9.3 B12 419, valproic acid 32 ( 50-100 nl).  PT OT has been consulted.  Outside records requested.Anticipating placement.  Abnormal UA urine culture has been sent received ceftriaxone in the ED x1 no fever no leukocytosis no dysuria symptoms.Follow-up cultures follow-up on antibiotics.  Essential hypertension: Poorly controlled blood pressure.  On lisinopril and bisoprolol at home.This morning blood pressure was 518- 841 systolic and DBP earlier in 119. Add prn po hydralazine and if remains persistently high will start new med.  Dementia/Depression with anxiety: We will continue venlafaxine trazodone, Depakote and Keppra. Cont Supportive care fall precaution PT OT.  CAD: No report of chest pain on home statin beta-blocker and ACE inhibitor.  History of CVA on Eliquis, continue the same. Pt reports her left side is weak from cva  Hypothyroidism:  Euthyroid, continue home Synthroid   DVT prophylaxis: Eliquis Code Status:   Code Status: Full Code  Family Communication: plan of care discussed with patient.  Significant other was updated on admission and mostly looking into placement  Status is: admitted as Observation Patient remains  hospitalized for ongoing evaluation work-up with PT OT and possible placement  Dispo: The patient is from: Home              Anticipated d/c is to: SNF              Anticipated d/c date is: once placement available. PT/OT eval pending.              Patient currently is medically stable to d/c.   Diet Order            Diet Heart Room service appropriate? Yes; Fluid consistency: Thin  Diet effective now                 There is no height or weight on file to calculate BMI.  Consultants: none  Procedures:none Microbiology: Blood Culture-none No results found for: SDES, SPECREQUEST, CULT, REPTSTATUS  Other culture-urine cx sent.  Medications: Scheduled Meds: . apixaban  5 mg Oral BID  . bisoprolol  10 mg Oral Daily  . divalproex  500 mg Oral BID  . fluticasone furoate-vilanterol  1 puff Inhalation Daily  . levETIRAcetam  500 mg Oral BID  . levothyroxine  50 mcg Oral Q0600  . lisinopril  40 mg Oral Daily  . rosuvastatin  5 mg Oral Daily  . sodium chloride flush  3 mL Intravenous Q12H  . traZODone  150 mg Oral QHS  . venlafaxine XR  150 mg Oral Daily   Continuous Infusions: . sodium chloride      Antimicrobials: Anti-infectives (From admission, onward)   Start     Dose/Rate Route Frequency Ordered Stop   11/11/19 2215  cefTRIAXone (ROCEPHIN) 1 g in sodium chloride 0.9 % 100 mL IVPB        1 g 200 mL/hr over 30 Minutes Intravenous  Once 11/11/19 2208 11/12/19 0000       Objective: Vitals: Today's Vitals   11/12/19 0137 11/12/19 0546 11/12/19 0603 11/12/19 0705  BP: (!) 182/94 (!) 145/119    Pulse:  69    Resp:  16    Temp:  98.1 F (36.7 C)    TempSrc:  Oral    SpO2:  94%    PainSc:   10-Worst pain ever Asleep    Intake/Output Summary (Last 24 hours) at 11/12/2019 0911 Last data filed at 11/12/2019 0604 Gross per 24 hour  Intake 780 ml  Output --  Net 780 ml   There were no vitals filed for this visit. Weight change:    Intake/Output from previous  day: 07/17 0701 - 07/18 0700 In: 780 [P.O.:180; IV Piggyback:600] Out: -  Intake/Output this shift: No intake/output data recorded.  Examination:  General exam: AAOx1-2 ,NAD, weak appearing. HEENT:Oral mucosa moist, Ear/Nose WNL grossly,dentition normal. Respiratory system: bilaterally clear,no wheezing or crackles,no use of accessory muscle, non tender. Cardiovascular system: S1 & S2 +, regular, No JVD. Gastrointestinal system: Abdomen soft, NT,ND, BS+. Nervous System:Alert, awake, moving extremities , stiff left side Extremities: No edema, distal peripheral pulses palpable.  Skin: No rashes,no icterus. MSK: Normal muscle bulk,tone, power  Data Reviewed: I have personally reviewed following labs and imaging studies CBC: Recent Labs  Lab 11/11/19 2015 11/12/19 0125  WBC 7.0 7.6  HGB 14.1 13.8  HCT 43.8 42.9  MCV 93.2 92.5  PLT 173 119   Basic Metabolic Panel: Recent Labs  Lab 11/11/19 2015 11/12/19 0125  NA 137 143  K 4.8 4.5  CL 103 106  CO2 25 27  GLUCOSE 98 99  BUN 18 19  CREATININE 0.96 0.93  CALCIUM 9.2 9.4   GFR: CrCl cannot be calculated (Unknown ideal weight.). Liver Function Tests: Recent Labs  Lab 11/11/19 2015  AST 15  ALT 8  ALKPHOS 40  BILITOT 0.7  PROT 7.2  ALBUMIN 3.4*   No results for input(s): LIPASE, AMYLASE in the last 168 hours. No results for input(s): AMMONIA in the last 168 hours. Coagulation Profile: No results for input(s): INR, PROTIME in the last 168 hours. Cardiac Enzymes: Recent Labs  Lab 11/12/19 0125  CKTOTAL 143   BNP (last 3 results) No results for input(s): PROBNP in the last 8760 hours. HbA1C: No results for input(s): HGBA1C in the last 72 hours. CBG: No results for input(s): GLUCAP in the last 168 hours. Lipid Profile: No results for input(s): CHOL, HDL, LDLCALC, TRIG, CHOLHDL, LDLDIRECT in the last 72 hours. Thyroid Function Tests: Recent Labs    11/11/19 2015  TSH 1.450   Anemia Panel: Recent Labs     11/12/19 0125  VITAMINB12 419  FOLATE 9.3   Sepsis Labs: No results for input(s): PROCALCITON, LATICACIDVEN in the last 168 hours.  Recent Results (from the past 240 hour(s))  SARS Coronavirus 2 by RT PCR (hospital order, performed in [facility] hospital lab) Nasopharyngeal Nasopharyngeal Swab     Status: None   Collection Time: 11/11/19 10:43 PM   Specimen: Nasopharyngeal Swab  Result Value Ref Range Status   SARS Coronavirus 2 NEGATIVE NEGATIVE Final    Comment: (NOTE) SARS-CoV-2 target nucleic acids are NOT DETECTED.  The SARS-CoV-2 RNA is generally detectable in upper and lower respiratory specimens during the acute phase of infection. The lowest concentration of SARS-CoV-2 viral copies this assay can detect is 250 copies / mL. A negative result does not preclude SARS-CoV-2 infection and should not be used as the sole basis for treatment or other patient management decisions.  A negative result may occur with improper specimen collection / handling, submission of specimen other than nasopharyngeal swab, presence of viral mutation(s) within the areas targeted by this assay, and inadequate number of viral copies (<250 copies / mL). A negative result must be combined with clinical observations, patient history, and epidemiological information.  Fact Sheet for Patients:   StrictlyIdeas.no  Fact Sheet for Healthcare Providers: BankingDealers.co.za  This test is not yet approved or  cleared by the [place] FDA and has been authorized for detection and/or diagnosis of SARS-CoV-2 by FDA under an Emergency Use Authorization (EUA).  This EUA will remain in effect (meaning this test can be used) for the duration of the COVID-19 declaration under Section 564(b)(1) of the Act, 21 U.S.C. section 360bbb-3(b)(1), unless the authorization is terminated or revoked sooner.  Performed at [facility] [place]  [address], [place], [place] [address]       Radiology Studies: DG CHEST PORT 1 VIEW  Result Date: 11/11/2019 CLINICAL DATA:  Lethargy EXAM: PORTABLE CHEST 1 VIEW COMPARISON:  November 10, 2019 FINDINGS: The heart size and mediastinal contours are within normal limits. Aortic knob calcifications. Both lungs are clear. The visualized skeletal structures are unremarkable. IMPRESSION: No active disease. Electronically Signed   By: Prudencio Pair M.D.   On:  11/11/2019 23:13     LOS: 0 days   Antonieta Pert, MD [facility]  11/12/2019, 9:11 AM

## 2019-11-12 NOTE — Progress Notes (Signed)
Leslie Lin's BP this am is 145/119. No acute distress noted. Denies CP,SOB, N/V when questioned. Notified Antonieta Pert of the above and will inform Day-Shift nurse. Thanks

## 2019-11-13 ENCOUNTER — Encounter (HOSPITAL_COMMUNITY): Payer: Self-pay | Admitting: Family Medicine

## 2019-11-13 DIAGNOSIS — L8990 Pressure ulcer of unspecified site, unspecified stage: Secondary | ICD-10-CM | POA: Insufficient documentation

## 2019-11-13 DIAGNOSIS — R531 Weakness: Secondary | ICD-10-CM | POA: Diagnosis not present

## 2019-11-13 NOTE — Progress Notes (Signed)
PROGRESS NOTE    Leslie Lin  PPI:[id number] DOB: 1950-09-05 DOA: 11/11/2019 PCP: Amado Nash, MD   Chief Complaint  Patient presents with  . Weakness    Brief Narrative: As per HPI: 69 y.o. female with medical history significant for dementia, coronary artery disease, history of CVA, depression, anxiety, and hypothyroidism, now presenting to the emergency department for evaluation of general weakness and lethargy.  She is accompanied by her significant other who is also her POA.  Patient has had slowly progressive general weakness and cognitive decline over the past several months, is no longer able to ambulate with a walker due to this, has been cared for at home by her significant other, and he reports being unable to get her out of bed in the past couple weeks due to her progressive general weakness and lethargy.  He could previously get her up with a Harrel Lemon lift.  The patient has not voiced any specific complaints other than some low back pain which is localized to the central low back, worse with lying in the same position for prolonged periods, and without any alleviating factors identified.  There are has not been any fevers documented and the patient denies any chills.  She denies shortness of breath, cough, chest discomfort, dysuria, or flank pain.  No recent seizure-like activity has been observed and she has not had any difficulty swallowing or speaking.  Patient reports that her appetite is good but her significant other feels that she has not been eating or drinking enough recently.  She was apparently seen for similar complaints at [facility] emergency department on 11/10/2019.  ED Course: Upon arrival to the ED, patient is found to be afebrile, saturating low to mid 90s on room air, and with normal respiratory rate, normal heart rate, and stable blood pressure.  Chest x-ray is negative for acute cardiopulmonary disease.  CMP and CBC are unremarkable.  TSH is normal.   Urinalysis with moderate hemoglobin and moderate leukocyte esterase.  Urine was sent for culture, patient was given 500 cc of normal saline, and 1 g IV Rocephin.  COVID-19 screening test is in process.  Hospitalists were consulted for admission.  Subjective:  Seen this morning, no new complaints resting comfortably.  Pleasantly confused  Blood pressure fluctuating 118-160s today  Assessment & Plan:  General weakness with functional mobility limitation balance deficits associated with vertigo and prior CVA, limited endurance in the setting of dementia with associated cognitive deficits:unable to get out of bed in the past 2 weeks with the help of significant other.  TSH normal no sign symptoms of infection overall labs stable.  CK 143, folate 9.3 B12 419, valproic acid 32 ( 50-100 nl).  Continue to work with PT, OT.  Skilled nursing facility has been recommended.  Abnormal UA urine culture has been sent received ceftriaxone in the ED x1 no fever no leukocytosis no dysuria symptoms.continue to hold off on antibiotics.  Essential hypertension: Blood pressure fluctuating  [id number] systolic today.  We will continue her home lisinopril and bisoprolol , continue prn po hydralazine.  Dementia/Depression with anxiety: Mood is stable pleasantly confused.  Continue her home venlafaxine trazodone, Depakote and Keppra. Cont Supportive care fall precaution PT OT.  CAD: No report of chest pain on home statin beta-blocker and ACE inhibitor.  History of CVA on Eliquis, continue the same. Pt reports her left side is weak from cva  Hypothyroidism: Euthyroid, continue home Synthroid  DVT prophylaxis: Eliquis Code Status:   Code Status:  Full Code  Family Communication: plan of care discussed with patient.  Significant other was updated on admission and mostly looking into placement  Status is: admitted as Observation Patient remains hospitalized for ongoing evaluation work-up with PT OT and possible  placement  Dispo: The patient is from: Home              Anticipated d/c is to: SNF continue to work with PT OT.              Anticipated d/c date is: once placement available.               Patient currently is medically stable to d/c.   Diet Order            Diet Heart Room service appropriate? Yes; Fluid consistency: Thin  Diet effective now                 There is no height or weight on file to calculate BMI.  Consultants: none  Procedures:none Microbiology: Blood Culture-none No results found for: SDES, SPECREQUEST, CULT, REPTSTATUS  Other culture-urine cx sent.  Medications: Scheduled Meds: . apixaban  5 mg Oral BID  . bisoprolol  10 mg Oral Daily  . divalproex  500 mg Oral BID  . fluticasone furoate-vilanterol  1 puff Inhalation Daily  . levETIRAcetam  500 mg Oral BID  . levothyroxine  50 mcg Oral Q0600  . lisinopril  40 mg Oral Daily  . rosuvastatin  5 mg Oral Daily  . sodium chloride flush  3 mL Intravenous Q12H  . traZODone  150 mg Oral QHS  . venlafaxine XR  150 mg Oral Daily   Continuous Infusions: . sodium chloride      Antimicrobials: Anti-infectives (From admission, onward)   Start     Dose/Rate Route Frequency Ordered Stop   11/11/19 2215  cefTRIAXone (ROCEPHIN) 1 g in sodium chloride 0.9 % 100 mL IVPB        1 g 200 mL/hr over 30 Minutes Intravenous  Once 11/11/19 2208 11/12/19 0000       Objective: Vitals: Today's Vitals   11/13/19 0636 11/13/19 0843 11/13/19 1206 11/13/19 1240  BP: 118/68  (!) 165/76   Pulse: 62  69   Resp: 14  16   Temp: 97.9 F (36.6 C)  97.6 F (36.4 C)   TempSrc: Oral  Oral   SpO2: 91% 93% 94%   PainSc:    0-No pain    Intake/Output Summary (Last 24 hours) at 11/13/2019 1508 Last data filed at 11/12/2019 2217 Gross per 24 hour  Intake 360 ml  Output --  Net 360 ml   There were no vitals filed for this visit. Weight change:    Intake/Output from previous day: 07/18 0701 - 07/19 0700 In: 720  [P.O.:720] Out: 3 [Urine:2; Stool:1] Intake/Output this shift: No intake/output data recorded.  Examination:  General exam: AAO x 0-1 , NAD, weak appearing. HEENT:Oral mucosa moist, Ear/Nose WNL grossly, dentition normal. Respiratory system: bilaterally  clear,no wheezing or crackles,no use of accessory muscle Cardiovascular system: S1 & S2 +, No JVD,. Gastrointestinal system: Abdomen soft, NT,ND, BS+ Nervous System:Alert, awake, stiff legs/ not following commands Extremities: No edema, distal peripheral pulses palpable.  Skin: No rashes,no icterus. MSK: Normal muscle bulk,tone, power  Data Reviewed: I have personally reviewed following labs and imaging studies CBC: Recent Labs  Lab 11/11/19 2015 11/12/19 0125  WBC 7.0 7.6  HGB 14.1 13.8  HCT 43.8 42.9  MCV  93.2 92.5  PLT 173 646   Basic Metabolic Panel: Recent Labs  Lab 11/11/19 2015 11/12/19 0125  NA 137 143  K 4.8 4.5  CL 103 106  CO2 25 27  GLUCOSE 98 99  BUN 18 19  CREATININE 0.96 0.93  CALCIUM 9.2 9.4   GFR: CrCl cannot be calculated (Unknown ideal weight.). Liver Function Tests: Recent Labs  Lab 11/11/19 2015  AST 15  ALT 8  ALKPHOS 40  BILITOT 0.7  PROT 7.2  ALBUMIN 3.4*   No results for input(s): LIPASE, AMYLASE in the last 168 hours. No results for input(s): AMMONIA in the last 168 hours. Coagulation Profile: No results for input(s): INR, PROTIME in the last 168 hours. Cardiac Enzymes: Recent Labs  Lab 11/12/19 0125  CKTOTAL 143   BNP (last 3 results) No results for input(s): PROBNP in the last 8760 hours. HbA1C: No results for input(s): HGBA1C in the last 72 hours. CBG: No results for input(s): GLUCAP in the last 168 hours. Lipid Profile: No results for input(s): CHOL, HDL, LDLCALC, TRIG, CHOLHDL, LDLDIRECT in the last 72 hours. Thyroid Function Tests: Recent Labs    11/11/19 2015  TSH 1.450   Anemia Panel: Recent Labs    11/12/19 0125  VITAMINB12 419  FOLATE 9.3    Sepsis Labs: No results for input(s): PROCALCITON, LATICACIDVEN in the last 168 hours.  Recent Results (from the past 240 hour(s))  SARS Coronavirus 2 by RT PCR (hospital order, performed in [facility] hospital lab) Nasopharyngeal Nasopharyngeal Swab     Status: None   Collection Time: 11/11/19 10:43 PM   Specimen: Nasopharyngeal Swab  Result Value Ref Range Status   SARS Coronavirus 2 NEGATIVE NEGATIVE Final    Comment: (NOTE) SARS-CoV-2 target nucleic acids are NOT DETECTED.  The SARS-CoV-2 RNA is generally detectable in upper and lower respiratory specimens during the acute phase of infection. The lowest concentration of SARS-CoV-2 viral copies this assay can detect is 250 copies / mL. A negative result does not preclude SARS-CoV-2 infection and should not be used as the sole basis for treatment or other patient management decisions.  A negative result may occur with improper specimen collection / handling, submission of specimen other than nasopharyngeal swab, presence of viral mutation(s) within the areas targeted by this assay, and inadequate number of viral copies (<250 copies / mL). A negative result must be combined with clinical observations, patient history, and epidemiological information.  Fact Sheet for Patients:   StrictlyIdeas.no  Fact Sheet for Healthcare Providers: BankingDealers.co.za  This test is not yet approved or  cleared by the [place] FDA and has been authorized for detection and/or diagnosis of SARS-CoV-2 by FDA under an Emergency Use Authorization (EUA).  This EUA will remain in effect (meaning this test can be used) for the duration of the COVID-19 declaration under Section 564(b)(1) of the Act, 21 U.S.C. section 360bbb-3(b)(1), unless the authorization is terminated or revoked sooner.  Performed at [facility], [place] [address] [place], [place] [address]        Radiology Studies: DG CHEST PORT 1 VIEW  Result Date: 11/11/2019 CLINICAL DATA:  Lethargy EXAM: PORTABLE CHEST 1 VIEW COMPARISON:  November 10, 2019 FINDINGS: The heart size and mediastinal contours are within normal limits. Aortic knob calcifications. Both lungs are clear. The visualized skeletal structures are unremarkable. IMPRESSION: No active disease. Electronically Signed   By: Prudencio Pair M.D.   On: 11/11/2019 23:13     LOS: 0 days  Antonieta Pert, MD [facility]  11/13/2019, 3:08 PM

## 2019-11-13 NOTE — NC FL2 (Addendum)
[place] LEVEL OF CARE SCREENING TOOL     IDENTIFICATION  Patient Name: Leslie Lin Birthdate: 1950-08-05 Sex: female Admission Date (Current Location): 11/11/2019  [facility] and [place] Number:  Herbalist and Address:  [facility],  [place] [address], [place]      Provider Number: [id number]  Attending Physician Name and Address:  Antonieta Pert, MD  Relative Name and Phone Number:       Current Level of Care: Hospital Recommended Level of Care: [place] Prior Approval Number:    Date Approved/Denied:   PASRR Number:  [id number]  Discharge Plan: SNF    Current Diagnoses: Patient Active Problem List   Diagnosis Date Noted  . General weakness 11/11/2019  . Dementia ([place])   . CAD (coronary artery disease)   . History of CVA (cerebrovascular accident)   . Hypothyroidism   . Essential hypertension   . Depression with anxiety     Orientation RESPIRATION BLADDER Height & Weight     Self  Normal Incontinent Weight:   Height:     BEHAVIORAL SYMPTOMS/MOOD NEUROLOGICAL BOWEL NUTRITION STATUS      Continent Diet (Heart Healthy)  AMBULATORY STATUS COMMUNICATION OF NEEDS Skin   Extensive Assist Verbally Normal                       Personal Care Assistance Level of Assistance  Bathing, Dressing, Feeding Bathing Assistance: Maximum assistance Feeding assistance: Limited assistance Dressing Assistance: Maximum assistance     Functional Limitations Info             SPECIAL CARE FACTORS FREQUENCY  PT (By licensed PT), OT (By licensed OT)     PT Frequency: 5 x weekly OT Frequency: 5 x weekly            Contractures Contractures Info: Not present    Additional Factors Info  Code Status, Allergies Code Status Info: Full Allergies Info: Latex           Current Medications (11/13/2019):  This is the current hospital active medication list Current Facility-Administered Medications   Medication Dose Route Frequency Provider Last Rate Last Admin  . 0.9 %  sodium chloride infusion  250 mL Intravenous PRN Opyd, Ilene Qua, MD      . acetaminophen (TYLENOL) tablet 650 mg  650 mg Oral Q6H PRN Opyd, Ilene Qua, MD       Or  . acetaminophen (TYLENOL) suppository 650 mg  650 mg Rectal Q6H PRN Opyd, Ilene Qua, MD      . apixaban (ELIQUIS) tablet 5 mg  5 mg Oral BID Opyd, Ilene Qua, MD   5 mg at 11/13/19 1032  . bisoprolol (ZEBETA) tablet 10 mg  10 mg Oral Daily Opyd, Ilene Qua, MD   10 mg at 11/13/19 1032  . divalproex (DEPAKOTE) DR tablet 500 mg  500 mg Oral BID Opyd, Ilene Qua, MD   500 mg at 11/13/19 1032  . fluticasone furoate-vilanterol (BREO ELLIPTA) 200-25 MCG/INH 1 puff  1 puff Inhalation Daily Opyd, Ilene Qua, MD   1 puff at 11/13/19 0843  . hydrALAZINE (APRESOLINE) tablet 25 mg  25 mg Oral Q6H PRN Kc, Ramesh, MD      . HYDROcodone-acetaminophen (NORCO/VICODIN) 5-325 MG per tablet 1 tablet  1 tablet Oral Q8H PRN Opyd, Ilene Qua, MD   1 tablet at 11/13/19 0306  . levETIRAcetam (KEPPRA) tablet 500 mg  500 mg Oral BID Opyd, Christia Reading  S, MD   500 mg at 11/13/19 1032  . levothyroxine (SYNTHROID) tablet 50 mcg  50 mcg Oral Q0600 Vianne Bulls, MD   50 mcg at 11/13/19 0650  . lisinopril (ZESTRIL) tablet 40 mg  40 mg Oral Daily Kc, Ramesh, MD   40 mg at 11/13/19 1032  . ondansetron (ZOFRAN) tablet 4 mg  4 mg Oral Q6H PRN Opyd, Ilene Qua, MD       Or  . ondansetron (ZOFRAN) injection 4 mg  4 mg Intravenous Q6H PRN Opyd, Ilene Qua, MD   4 mg at 11/12/19 0259  . rosuvastatin (CRESTOR) tablet 5 mg  5 mg Oral Daily Opyd, Ilene Qua, MD   5 mg at 11/13/19 1033  . senna-docusate (Senokot-S) tablet 1 tablet  1 tablet Oral QHS PRN Opyd, Ilene Qua, MD   1 tablet at 11/12/19 0314  . sodium chloride flush (NS) 0.9 % injection 3 mL  3 mL Intravenous Q12H Opyd, Ilene Qua, MD   3 mL at 11/13/19 1040  . sodium chloride flush (NS) 0.9 % injection 3 mL  3 mL Intravenous PRN Opyd, Ilene Qua, MD      .  traZODone (DESYREL) tablet 150 mg  150 mg Oral QHS Opyd, Ilene Qua, MD   150 mg at 11/12/19 2210  . venlafaxine XR (EFFEXOR-XR) 24 hr capsule 150 mg  150 mg Oral Daily Opyd, Ilene Qua, MD   150 mg at 11/13/19 1032     Discharge Medications: Please see discharge summary for a list of discharge medications.  Relevant Imaging Results:  Relevant Lab Results:   Additional Information ss# [id number]  Latica Hohmann, Marjie Skiff, RN

## 2019-11-14 DIAGNOSIS — F418 Other specified anxiety disorders: Secondary | ICD-10-CM | POA: Diagnosis present

## 2019-11-14 DIAGNOSIS — I160 Hypertensive urgency: Secondary | ICD-10-CM | POA: Diagnosis present

## 2019-11-14 DIAGNOSIS — L89312 Pressure ulcer of right buttock, stage 2: Secondary | ICD-10-CM | POA: Diagnosis present

## 2019-11-14 DIAGNOSIS — Z853 Personal history of malignant neoplasm of breast: Secondary | ICD-10-CM | POA: Diagnosis not present

## 2019-11-14 DIAGNOSIS — E039 Hypothyroidism, unspecified: Secondary | ICD-10-CM | POA: Diagnosis present

## 2019-11-14 DIAGNOSIS — I10 Essential (primary) hypertension: Secondary | ICD-10-CM | POA: Diagnosis present

## 2019-11-14 DIAGNOSIS — I2510 Atherosclerotic heart disease of native coronary artery without angina pectoris: Secondary | ICD-10-CM | POA: Diagnosis present

## 2019-11-14 DIAGNOSIS — Z79899 Other long term (current) drug therapy: Secondary | ICD-10-CM | POA: Diagnosis not present

## 2019-11-14 DIAGNOSIS — I252 Old myocardial infarction: Secondary | ICD-10-CM | POA: Diagnosis not present

## 2019-11-14 DIAGNOSIS — Z7901 Long term (current) use of anticoagulants: Secondary | ICD-10-CM | POA: Diagnosis not present

## 2019-11-14 DIAGNOSIS — Z7951 Long term (current) use of inhaled steroids: Secondary | ICD-10-CM | POA: Diagnosis not present

## 2019-11-14 DIAGNOSIS — Z20822 Contact with and (suspected) exposure to covid-19: Secondary | ICD-10-CM | POA: Diagnosis present

## 2019-11-14 DIAGNOSIS — R627 Adult failure to thrive: Secondary | ICD-10-CM | POA: Diagnosis present

## 2019-11-14 DIAGNOSIS — Z91040 Latex allergy status: Secondary | ICD-10-CM | POA: Diagnosis not present

## 2019-11-14 DIAGNOSIS — R0902 Hypoxemia: Secondary | ICD-10-CM | POA: Diagnosis present

## 2019-11-14 DIAGNOSIS — R531 Weakness: Secondary | ICD-10-CM | POA: Diagnosis present

## 2019-11-14 DIAGNOSIS — Z79890 Hormone replacement therapy (postmenopausal): Secondary | ICD-10-CM | POA: Diagnosis not present

## 2019-11-14 DIAGNOSIS — I69354 Hemiplegia and hemiparesis following cerebral infarction affecting left non-dominant side: Secondary | ICD-10-CM | POA: Diagnosis not present

## 2019-11-14 DIAGNOSIS — R8290 Unspecified abnormal findings in urine: Secondary | ICD-10-CM | POA: Diagnosis present

## 2019-11-14 DIAGNOSIS — F0390 Unspecified dementia without behavioral disturbance: Secondary | ICD-10-CM | POA: Diagnosis present

## 2019-11-14 MED ORDER — LABETALOL HCL 5 MG/ML IV SOLN
10.0000 mg | Freq: Once | INTRAVENOUS | Status: AC
  Administered 2019-11-14: 10 mg via INTRAVENOUS
  Filled 2019-11-14: qty 4

## 2019-11-14 MED ORDER — HYDROCHLOROTHIAZIDE 12.5 MG PO CAPS
12.5000 mg | ORAL_CAPSULE | Freq: Every evening | ORAL | Status: DC
  Filled 2019-11-14: qty 1

## 2019-11-14 NOTE — Progress Notes (Signed)
   11/14/19 0342  Vitals  Temp 98.4 F (36.9 C)  Temp Source Oral  BP (!) 205/93  MAP (mmHg) 125  BP Location Right Arm  BP Method Automatic  Patient Position (if appropriate) Lying  Pulse Rate 63  Resp 17  Level of Consciousness  Level of Consciousness Alert  Oxygen Therapy  SpO2 97 %  O2 Device Nasal Cannula (for comfort/anxiety)  O2 Flow Rate (L/min) 2 L/min  MEWS Score  MEWS Temp 0  MEWS Systolic 2  MEWS Pulse 0  MEWS RR 0  MEWS LOC 0  MEWS Score 2  MEWS Score Color Yellow  Provider Notification  Provider Name/Title Lang Snow, NP  Date Provider Notified 11/14/19  Time Provider Notified [phone]  Notification Type Page  Notification Reason Other (Comment) (BP)  Response See new orders (recheck: BP still elevated: labetalol admin per order)  Date of Provider Response 11/14/19  Time of Provider Response 0424 (no)  Rapid Response Notification  Name of Rapid Response RN Notified  (N/A)  Date Rapid Response Notified  (N/A)  Time Rapid Response Notified  (N/A)  Note  Observations  (Pt anxious, got VS, BP elevated, admin PRN hyd/ O2 comfort)  Pt still elevated, admin labetalol x2 per order. Called RR, Pt now green MEWS

## 2019-11-14 NOTE — Plan of Care (Signed)
Pt VS WNL this am.  No complaints at this time.   Problem: Education: Goal: Knowledge of General Education information will improve Description: Including pain rating scale, medication(s)/side effects and non-pharmacologic comfort measures Outcome: Progressing   Problem: Clinical Measurements: Goal: Ability to maintain clinical measurements within normal limits will improve Outcome: Progressing Goal: Will remain free from infection Outcome: Progressing Goal: Diagnostic test results will improve Outcome: Progressing Goal: Respiratory complications will improve Outcome: Progressing Goal: Cardiovascular complication will be avoided Outcome: Progressing   Problem: Nutrition: Goal: Adequate nutrition will be maintained Outcome: Progressing   Problem: Coping: Goal: Level of anxiety will decrease Outcome: Progressing   Problem: Elimination: Goal: Will not experience complications related to urinary retention Outcome: Progressing   Problem: Pain Managment: Goal: General experience of comfort will improve Outcome: Progressing   Problem: Safety: Goal: Ability to remain free from injury will improve Outcome: Progressing   Problem: Skin Integrity: Goal: Risk for impaired skin integrity will decrease Outcome: Progressing   Problem: Health Behavior/Discharge Planning: Goal: Ability to manage health-related needs will improve Outcome: Not Progressing   Problem: Activity: Goal: Risk for activity intolerance will decrease Outcome: Not Progressing   Problem: Elimination: Goal: Will not experience complications related to bowel motility Outcome: Not Progressing

## 2019-11-14 NOTE — TOC Initial Note (Signed)
Transition of Care [facility]) - Initial/Assessment Note    Patient Details  Name: Leslie Lin MRN: [id number] Date of Birth: [birth year]  Transition of [facility]) CM/SW Contact:    Lynnell Catalan, RN Phone Number: 11/14/2019, 1:39 PM  Clinical Narrative:                  [facility] consult for SNF placement. Pt is managed by Claudia Desanctis in [facility]). This CM contacted [facility] CSW Anderson Malta who has been working with pt husband to place her in SNF. This CM faxed out FL2 to SNFs that are contracted with [facility] per Jennifer's instruction. [place] are able to offer pt a bed. Jennifer to secure bed for pt at [facility] for tomorrow at [employer].   Expected Discharge Plan: [place] Barriers to Discharge: No Barriers Identified  Expected Discharge Plan and Services Expected Discharge Plan: [place]   Discharge Planning Services: CM Consult     Prior Living Arrangements/Services   Lives with:: Spouse            Care giver support system in place?: Yes (comment)      Activities of Daily Living Home Assistive Devices/Equipment: [employer] fast streamer, Environmental consultant (specify type), Eyeglasses ADL Screening (condition at time of admission) Patient's cognitive ability adequate to safely complete daily activities?: No Is the patient deaf or have difficulty hearing?: No Does the patient have difficulty seeing, even when wearing glasses/contacts?: No Does the patient have difficulty concentrating, remembering, or making decisions?: Yes Patient able to express need for assistance with ADLs?: No Does the patient have difficulty dressing or bathing?: Yes Independently performs ADLs?: No Communication: Appropriate for developmental age Dressing (OT): Dependent Is this a change from baseline?: Change from baseline, expected to last >3 days Grooming: Dependent Is this a change from baseline?: Change from baseline, expected to last >3 days Feeding: Dependent Is this  a change from baseline?: Change from baseline, expected to last >3 days Bathing: Dependent Is this a change from baseline?: Change from baseline, expected to last >3 days Toileting: Dependent Is this a change from baseline?: Change from baseline, expected to last >3days In/Out Bed: Dependent Is this a change from baseline?: Change from baseline, expected to last >3 days Walks in Home: Dependent Is this a change from baseline?: Change from baseline, expected to last >3 days Does the patient have difficulty walking or climbing stairs?: Yes (secondary to weakness) Weakness of Legs: Both Weakness of Arms/Hands: Both  Permission Sought/Granted                  Emotional Assessment              Admission diagnosis:  Hypoxia [R09.02] General weakness [R53.1] Patient Active Problem List   Diagnosis Date Noted  . Pressure injury of skin 11/13/2019  . General weakness 11/11/2019  . Dementia ([place])   . CAD (coronary artery disease)   . History of CVA (cerebrovascular accident)   . Hypothyroidism   . Essential hypertension   . Depression with anxiety    PCP:  Amado Nash, MD Pharmacy:   [place], [address] [place] [address] Phone: [phone] Fax: [phone]     Social Determinants of Health (SDOH) Interventions    Readmission Risk Interventions No flowsheet data found.

## 2019-11-14 NOTE — Progress Notes (Signed)
PROGRESS NOTE    Leslie Lin  IEP:[id number] DOB: July 06, 1950 DOA: 11/11/2019 PCP: Amado Nash, MD   Chief Complaint  Patient presents with  . Weakness    Brief Narrative: As per HPI: 70 y.o. female with medical history significant for dementia, coronary artery disease, history of CVA, depression, anxiety, and hypothyroidism, now presenting to the emergency department for evaluation of general weakness and lethargy.  She is accompanied by her significant other who is also her POA.  Patient has had slowly progressive general weakness and cognitive decline over the past several months, is no longer able to ambulate with a walker due to this, has been cared for at home by her significant other, and he reports being unable to get her out of bed in the past couple weeks due to her progressive general weakness and lethargy.  He could previously get her up with a Harrel Lemon lift.  The patient has not voiced any specific complaints other than some low back pain which is localized to the central low back, worse with lying in the same position for prolonged periods, and without any alleviating factors identified.  There are has not been any fevers documented and the patient denies any chills.  She denies shortness of breath, cough, chest discomfort, dysuria, or flank pain.  No recent seizure-like activity has been observed and she has not had any difficulty swallowing or speaking.  Patient reports that her appetite is good but her significant other feels that she has not been eating or drinking enough recently.  She was apparently seen for similar complaints at [facility] emergency department on 11/10/2019.  ED Course: Upon arrival to the ED, patient is found to be afebrile, saturating low to mid 90s on room air, and with normal respiratory rate, normal heart rate, and stable blood pressure.  Chest x-ray is negative for acute cardiopulmonary disease.  CMP and CBC are unremarkable.  TSH is normal.   Urinalysis with moderate hemoglobin and moderate leukocyte esterase.  Urine was sent for culture, patient was given 500 cc of normal saline, and 1 g IV Rocephin.  COVID-19 screening test ordered.Hospitalists were consulted for admission. Patient remains hospitalized awaiting on placement. Have uncontrolled hypertension up to 209/88 during the night, needing labetalol injection x2   Subjective: Seen this morning alert awake pleasantly confused.  Being fed by the nursing assistance.  No complaints. Got labetalol  inj 10 mg x 2 overnight blood pressure was in 200.  Assessment & Plan:  General weakness with functional mobility limitation balance deficits associated with vertigo and prior CVA, limited endurance in the setting of dementia with associated cognitive deficits:unable to get out of bed in the past 2 weeks with the help of significant other.  TSH normal no sign symptoms of infection CK 143, folate 9.3 B12 419, valproic acid 32 ( 50-100 nl).  Remains weak and deconditioned, will need a skilled nursing facility placement.  Continue PT OT. Has Bed available at SNF from today.  Uncontrolled hypertension blood pressure in 200.  Has been ranging high and fluctuating.  Needed labetalol injection overnight multiple times.  Will continue on home lisinopril, bisoprolol, add hydrochlorothiazide at bedtime, Cont Po hydralazine prn. Monitor overnight.  Abnormal UA urine culture has been sent received ceftriaxone in the ED x1 no fever no leukocytosis no dysuria symptoms.continue to hold off on antibiotics.  Dementia/Depression with anxiety: Patient is pleasantly confused mood is stable.  Continue supportive care feeding with assistance, PT OT.  Continue her home home  venlafaxine trazodone, Depakote and Keppra. Check depakote level, was low on admission, ?compliance  CAD: No report of chest pain .  Continue her beta-blocker statin and lisinopril.    History of CVA on Eliquis, cont the same.Pt reports her  left side is weak from cva  Hypothyroidism: Euthyroid, continue home Synthroid  DVT prophylaxis: Eliquis Code Status:   Code Status: Full Code  Family Communication: plan of care discussed with patient's significant other over the phone today. Agreeable for d/c tomorrow.  Status is: admitted as Observation Patient remains hospitalized for ongoing source on a skilled nursing facility due to her debility, during hospitalization found to have uncontrolled hypertension with blood pressure in 200 needing IV blood pressure medication to stabilize her BP- and adding new antihypertensive regimen and monitoring next 24 hours, will move her to inpatient status.    Dispo: The patient is from: Home              Anticipated d/c is to: SNF               Anticipated d/c date is: 1 days              Patient currently is not medically stable.  Anticipate discharge tomorrow if blood pressure remains stable.   Diet Order            Diet Heart Room service appropriate? Yes; Fluid consistency: Thin  Diet effective now                 There is no height or weight on file to calculate BMI.  Consultants: none  Procedures:none Microbiology: Blood Culture-none No results found for: SDES, SPECREQUEST, CULT, REPTSTATUS  Other culture-urine cx sent.  Medications: Scheduled Meds: . apixaban  5 mg Oral BID  . bisoprolol  10 mg Oral Daily  . divalproex  500 mg Oral BID  . fluticasone furoate-vilanterol  1 puff Inhalation Daily  . hydrochlorothiazide  12.5 mg Oral QPM  . levETIRAcetam  500 mg Oral BID  . levothyroxine  50 mcg Oral Q0600  . lisinopril  40 mg Oral Daily  . rosuvastatin  5 mg Oral Daily  . sodium chloride flush  3 mL Intravenous Q12H  . traZODone  150 mg Oral QHS  . venlafaxine XR  150 mg Oral Daily   Continuous Infusions: . sodium chloride      Antimicrobials: Anti-infectives (From admission, onward)   Start     Dose/Rate Route Frequency Ordered Stop   11/11/19 2215   cefTRIAXone (ROCEPHIN) 1 g in sodium chloride 0.9 % 100 mL IVPB        1 g 200 mL/hr over 30 Minutes Intravenous  Once 11/11/19 2208 11/12/19 0000       Objective: Vitals: Today's Vitals   11/14/19 0800 11/14/19 1006 11/14/19 1302 11/14/19 1303  BP:  124/82 125/76 125/76  Pulse:  79 73 73  Resp:   15 15  Temp:   97.8 F (36.6 C) 97.8 F (36.6 C)  TempSrc:   Oral Oral  SpO2:   93% 93%  PainSc: 0-No pain       Intake/Output Summary (Last 24 hours) at 11/14/2019 1343 Last data filed at 11/13/2019 2021 Gross per 24 hour  Intake 50 ml  Output --  Net 50 ml   There were no vitals filed for this visit. Weight change:    Intake/Output from previous day: 07/19 0701 - 07/20 0700 In: 50 [P.O.:50] Out: -  Intake/Output this shift: No  intake/output data recorded.  Examination:  General exam: AAOx1-2 , NAD, weak appearing. HEENT:Oral mucosa moist, Ear/Nose WNL grossly, dentition normal. Respiratory system: bilaterally clear,no wheezing or crackles,no use of accessory muscle Cardiovascular system: S1 & S2 +, No JVD,. Gastrointestinal system: Abdomen soft, NT,ND, BS+ Nervous System:Alert, awake, moving extremities  Endorse some weakness on the left side Extremities: No edema, distal peripheral pulses palpable.  Skin: No rashes,no icterus. MSK: Normal muscle bulk,tone, power  Data Reviewed: I have personally reviewed following labs and imaging studies CBC: Recent Labs  Lab 11/11/19 2015 11/12/19 0125  WBC 7.0 7.6  HGB 14.1 13.8  HCT 43.8 42.9  MCV 93.2 92.5  PLT 173 716   Basic Metabolic Panel: Recent Labs  Lab 11/11/19 2015 11/12/19 0125  NA 137 143  K 4.8 4.5  CL 103 106  CO2 25 27  GLUCOSE 98 99  BUN 18 19  CREATININE 0.96 0.93  CALCIUM 9.2 9.4   GFR: CrCl cannot be calculated (Unknown ideal weight.). Liver Function Tests: Recent Labs  Lab 11/11/19 2015  AST 15  ALT 8  ALKPHOS 40  BILITOT 0.7  PROT 7.2  ALBUMIN 3.4*   No results for input(s):  LIPASE, AMYLASE in the last 168 hours. No results for input(s): AMMONIA in the last 168 hours. Coagulation Profile: No results for input(s): INR, PROTIME in the last 168 hours. Cardiac Enzymes: Recent Labs  Lab 11/12/19 0125  CKTOTAL 143   BNP (last 3 results) No results for input(s): PROBNP in the last 8760 hours. HbA1C: No results for input(s): HGBA1C in the last 72 hours. CBG: No results for input(s): GLUCAP in the last 168 hours. Lipid Profile: No results for input(s): CHOL, HDL, LDLCALC, TRIG, CHOLHDL, LDLDIRECT in the last 72 hours. Thyroid Function Tests: Recent Labs    11/11/19 2015  TSH 1.450   Anemia Panel: Recent Labs    11/12/19 0125  VITAMINB12 419  FOLATE 9.3   Sepsis Labs: No results for input(s): PROCALCITON, LATICACIDVEN in the last 168 hours.  Recent Results (from the past 240 hour(s))  SARS Coronavirus 2 by RT PCR (hospital order, performed in [facility] hospital lab) Nasopharyngeal Nasopharyngeal Swab     Status: None   Collection Time: 11/11/19 10:43 PM   Specimen: Nasopharyngeal Swab  Result Value Ref Range Status   SARS Coronavirus 2 NEGATIVE NEGATIVE Final    Comment: (NOTE) SARS-CoV-2 target nucleic acids are NOT DETECTED.  The SARS-CoV-2 RNA is generally detectable in upper and lower respiratory specimens during the acute phase of infection. The lowest concentration of SARS-CoV-2 viral copies this assay can detect is 250 copies / mL. A negative result does not preclude SARS-CoV-2 infection and should not be used as the sole basis for treatment or other patient management decisions.  A negative result may occur with improper specimen collection / handling, submission of specimen other than nasopharyngeal swab, presence of viral mutation(s) within the areas targeted by this assay, and inadequate number of viral copies (<250 copies / mL). A negative result must be combined with clinical observations, patient history, and epidemiological  information.  Fact Sheet for Patients:   StrictlyIdeas.no  Fact Sheet for Healthcare Providers: BankingDealers.co.za  This test is not yet approved or  cleared by the [place] FDA and has been authorized for detection and/or diagnosis of SARS-CoV-2 by FDA under an Emergency Use Authorization (EUA).  This EUA will remain in effect (meaning this test can be used) for the duration of the COVID-19 declaration under  Section 564(b)(1) of the Act, 21 U.S.C. section 360bbb-3(b)(1), unless the authorization is terminated or revoked sooner.  Performed at [facility], [place] [address] [place], [place] [address]       Radiology Studies: No results found.   LOS: 0 days   Antonieta Pert, MD [facility]  11/14/2019, 1:43 PM

## 2019-11-14 NOTE — Significant Event (Signed)
Rapid Response Event Note  Overview: Time Called: 0526 Arrival Time: 0530 Event Type: Other (Comment) (Hypertension)  Initial Focused Assessment: Primary nurse called concerned about patient having an hypertensive crises, nurse reports that blood pressure 209/88 with map of 110. Primary nurse notified provider new orders noted and received. Blood pressure when this writer arrived was 209/88 see flow sheet, patient received another PRN labetalol 10 mg at this time. At 0600 HR 72, Blood pressure 151/76     Interventions: PRN hydr ALAZINE  PRN Labetalol  Monitor closely.   Plan of Care (if not transferred): PRN hydr ALAZINE  PRN Labetalol  Monitor closely.    Event Summary: Name of Physician Notified: M. Sharlet Salina FNP at [phone]    at         [facility] MSN, RN-BC  [place]

## 2019-11-15 DIAGNOSIS — I10 Essential (primary) hypertension: Secondary | ICD-10-CM

## 2019-11-15 DIAGNOSIS — L8992 Pressure ulcer of unspecified site, stage 2: Secondary | ICD-10-CM

## 2019-11-15 LAB — SARS CORONAVIRUS 2 BY RT PCR (HOSPITAL ORDER, PERFORMED IN ~~LOC~~ HOSPITAL LAB): SARS Coronavirus 2: NEGATIVE

## 2019-11-15 LAB — VALPROIC ACID LEVEL: Valproic Acid Lvl: 72 ug/mL (ref 50.0–100.0)

## 2019-11-15 MED ORDER — SENNOSIDES-DOCUSATE SODIUM 8.6-50 MG PO TABS: 1.0000 | ORAL_TABLET | Freq: Every evening | ORAL | Status: AC | PRN

## 2019-11-15 MED ORDER — HYDROCODONE-ACETAMINOPHEN 5-325 MG PO TABS: 1.0000 | ORAL_TABLET | Freq: Three times a day (TID) | ORAL | 0 refills | Status: AC | PRN

## 2019-11-15 NOTE — TOC Transition Note (Signed)
Transition of Care [facility]) - CM/SW Discharge Note   Patient Details  Name: Leslie Lin MRN: [id number] Date of Birth: Dec 03, 1950  Transition of [facility]) CM/SW Contact:  Lynnell Catalan, RN Phone Number: 11/15/2019, 2:52 PM   Clinical Narrative:     Pt to DC to [facility] today. Per Anderson Malta at [place], they will set up their own transport for pt to go to [place]. RN made aware. RN to call report to [phone]    Barriers to Discharge: No Barriers Identified   Discharge Plan and Services   Discharge Planning Services: CM Consult              Social Determinants of Health (SDOH) Interventions     Readmission Risk Interventions No flowsheet data found.

## 2019-11-15 NOTE — Discharge Summary (Signed)
Physician Discharge Summary  Leslie Lin KXF:[id number] DOB: [birth year] DOA: 11/11/2019  PCP: Amado Nash, MD  Admit date: 11/11/2019 Discharge date: 11/15/2019  Time spent: 50 minutes  Recommendations for Outpatient Follow-up:  1. Patient will be discharged to skilled nursing facility.  Follow-up with MD at SNF.  Patient blood pressure need to be reassessed.  Patient will need a basic metabolic profile done in 1 week to follow-up on electrolytes and renal function.   Discharge Diagnoses:  Principal Problem:   General weakness Active Problems:   Dementia ([place])   CAD (coronary artery disease)   History of CVA (cerebrovascular accident)   Hypothyroidism   Essential hypertension   Depression with anxiety   Pressure injury of skin   Uncontrolled hypertension   Discharge Condition: Stable  Diet recommendation: Heart healthy  There were no vitals filed for this visit.  History of present illness:  HPI per Dr. Dene Gentry Culmer is a 69 y.o. female with medical history significant for dementia, coronary artery disease, history of CVA, depression, anxiety, and hypothyroidism, now presenting to the emergency department for evaluation of general weakness and lethargy.  She was accompanied by her significant other who is also her POA.  Patient has had slowly progressive general weakness and cognitive decline over the past several months, is no longer able to ambulate with a walker due to this, has been cared for at home by her significant other, and he reports being unable to get her out of bed in the past couple weeks due to her progressive general weakness and lethargy.  He could previously get her up with a Harrel Lemon lift.  The patient has not voiced any specific complaints other than some low back pain which is localized to the central low back, worse with lying in the same position for prolonged periods, and without any alleviating factors identified.  There are has not been any fevers  documented and the patient denies any chills.  She denies shortness of breath, cough, chest discomfort, dysuria, or flank pain.  No recent seizure-like activity has been observed and she has not had any difficulty swallowing or speaking.  Patient reports that her appetite is good but her significant other feels that she has not been eating or drinking enough recently.  She was apparently seen for similar complaints at [facility] emergency department on 11/10/2019.  ED Course: Upon arrival to the ED, patient is found to be afebrile, saturating low to mid 90s on room air, and with normal respiratory rate, normal heart rate, and stable blood pressure.  Chest x-ray is negative for acute cardiopulmonary disease.  CMP and CBC are unremarkable.  TSH is normal.  Urinalysis with moderate hemoglobin and moderate leukocyte esterase.  Urine was sent for culture, patient was given 500 cc of normal saline, and 1 g IV Rocephin.  COVID-19 screening test is in process.  Hospitalists were consulted for admission.  Hospital Course:  1 generalized weakness with functional mobility limitation balance deficits associated with vertigo and prior CVA Patient's endurance noted to be limited in the setting of dementia with associated cognitive deficits.  Patient noted to be unable to get out of bed over the past 2 weeks with the help of significant other.  Work-up to date was negative for any infectious etiology.  TSH within normal limits.  Folate was 9.3, vitamin B12 levels of 419, valproic acid level at 32.  Patient seen by PT OT who recommended SNF placement.  Patient will be discharged to SNF  in stable condition.  2.  Uncontrolled hypertension Patient noted to have blood pressure as high as the 200s and noted to be fluctuating.  Patient did receive multiple injections of labetalol.  Patient was placed back on home regimen of lisinopril bisoprolol and HCTZ added.  Patient also placed on hydralazine as needed.  Blood  pressure improved.  HCTZ subsequently discontinued.  Blood pressure remained stable such that by day of discharge blood pressure was 115/72.  Outpatient follow-up.  3.  Abnormal urinalysis Patient noted to have an abnormal UA on admission.  Patient remained afebrile, normal white count, no urinary symptoms.  Patient received a dose of IV Rocephin in the ED.  Patient not given any further antibiotics in the hospitalization.  No other associated symptoms.  4.  Dementia/depression with anxiety Patient pleasantly confused.  Mood is stable.  Patient seen by PT/ OT.  Patient maintained on home regimen of venlafaxine, trazodone, Depakote, Keppra.  Outpatient follow-up.  5.  Coronary artery disease Remained stable.  Patient maintained on home regimen of beta-blocker, statin and ACE inhibitor.  6.  History of CVA on Eliquis Patient noted to have a chronic left-sided weakness from prior CVA.  Patient with no acute or worsening symptoms.  Patient maintained on Eliquis, statin, antihypertensive medications.  Outpatient follow-up.  7.  Hypothyroidism Patient maintained on home regimen Synthroid.  Will need repeat thyroid function studies done in about 6 weeks.  8.  Pressure injury right buttocks, stage II, POA Continue current wound care. Pressure Injury 11/12/19 Buttocks Right Stage 2 -  Partial thickness loss of dermis presenting as a shallow open injury with a red, pink wound bed without slough. lower right buttocks (Active)  11/12/19 1139  Location: Buttocks  Location Orientation: Right  Staging: Stage 2 -  Partial thickness loss of dermis presenting as a shallow open injury with a red, pink wound bed without slough.  Wound Description (Comments): lower right buttocks  Present on Admission: Yes       Procedures:  Chest x-ray 11/11/2019  Consultations:  None  Discharge Exam: Vitals:   11/15/19 1020 11/15/19 1337  BP: 109/69 115/72  Pulse: 67 64  Resp: 14 16  Temp: (!) 97.5 F (36.4  C) 97.7 F (36.5 C)  SpO2: 97% 99%    General: NAD Cardiovascular: RRR Respiratory: CTAB  Discharge Instructions   Discharge Instructions    Diet - low sodium heart healthy   Complete by: As directed    Discharge wound care:   Complete by: As directed    Foam dressing to right buttocks every 3 days or PRN with soiling.   Increase activity slowly   Complete by: As directed      Allergies as of 11/15/2019      Reactions   Latex Hives      Medication List    TAKE these medications   Align 4 MG Caps Take 4 mg by mouth daily.   bisoprolol 10 MG tablet Commonly known as: ZEBETA Take 10 mg by mouth daily.   Breo Ellipta 200-25 MCG/INH Aepb Generic drug: fluticasone furoate-vilanterol Inhale 1 puff into the lungs daily.   calcium-vitamin D 250-125 MG-UNIT tablet Commonly known as: OSCAL WITH D Take 1 tablet by mouth daily.   divalproex 500 MG DR tablet Commonly known as: DEPAKOTE Take 500 mg by mouth 2 (two) times daily.   Eliquis 5 MG Tabs tablet Generic drug: apixaban Take 5 mg by mouth 2 (two) times daily.   HYDROcodone-acetaminophen 5-325 MG  tablet Commonly known as: NORCO/VICODIN Take 1 tablet by mouth every 8 (eight) hours as needed for moderate pain.   levETIRAcetam 500 MG tablet Commonly known as: KEPPRA Take 500 mg by mouth 2 (two) times daily.   levothyroxine 50 MCG tablet Commonly known as: SYNTHROID Take 50 mcg by mouth daily before breakfast.   lisinopril 40 MG tablet Commonly known as: ZESTRIL Take 40 mg by mouth daily.   rosuvastatin 5 MG tablet Commonly known as: CRESTOR Take 5 mg by mouth daily.   senna-docusate 8.6-50 MG tablet Commonly known as: Senokot-S Take 1 tablet by mouth at bedtime as needed for mild constipation.   traZODone 150 MG tablet Commonly known as: DESYREL Take 150 mg by mouth at bedtime.   Venlafaxine HCl 150 MG Tb24 Take 150 mg by mouth daily.            Discharge Care Instructions  (From  admission, onward)         Start     Ordered   11/15/19 0000  Discharge wound care:       Comments: Foam dressing to right buttocks every 3 days or PRN with soiling.   11/15/19 1441         Allergies  Allergen Reactions  . Latex Hives    Follow-up Information    MD AT SNF Follow up.                The results of significant diagnostics from this hospitalization (including imaging, microbiology, ancillary and laboratory) are listed below for reference.    Significant Diagnostic Studies: DG CHEST PORT 1 VIEW  Result Date: 11/11/2019 CLINICAL DATA:  Lethargy EXAM: PORTABLE CHEST 1 VIEW COMPARISON:  November 10, 2019 FINDINGS: The heart size and mediastinal contours are within normal limits. Aortic knob calcifications. Both lungs are clear. The visualized skeletal structures are unremarkable. IMPRESSION: No active disease. Electronically Signed   By: Prudencio Pair M.D.   On: 11/11/2019 23:13    Microbiology: Recent Results (from the past 240 hour(s))  SARS Coronavirus 2 by RT PCR (hospital order, performed in [facility] hospital lab) Nasopharyngeal Nasopharyngeal Swab     Status: None   Collection Time: 11/11/19 10:43 PM   Specimen: Nasopharyngeal Swab  Result Value Ref Range Status   SARS Coronavirus 2 NEGATIVE NEGATIVE Final    Comment: (NOTE) SARS-CoV-2 target nucleic acids are NOT DETECTED.  The SARS-CoV-2 RNA is generally detectable in upper and lower respiratory specimens during the acute phase of infection. The lowest concentration of SARS-CoV-2 viral copies this assay can detect is 250 copies / mL. A negative result does not preclude SARS-CoV-2 infection and should not be used as the sole basis for treatment or other patient management decisions.  A negative result may occur with improper specimen collection / handling, submission of specimen other than nasopharyngeal swab, presence of viral mutation(s) within the areas targeted by this assay, and inadequate number  of viral copies (<250 copies / mL). A negative result must be combined with clinical observations, patient history, and epidemiological information.  Fact Sheet for Patients:   StrictlyIdeas.no  Fact Sheet for Healthcare Providers: BankingDealers.co.za  This test is not yet approved or  cleared by the [place] FDA and has been authorized for detection and/or diagnosis of SARS-CoV-2 by FDA under an Emergency Use Authorization (EUA).  This EUA will remain in effect (meaning this test can be used) for the duration of the COVID-19 declaration under Section 564(b)(1) of the Act, 21  U.S.C. section 360bbb-3(b)(1), unless the authorization is terminated or revoked sooner.  Performed at [facility], [place] [address] [place], [place] [address]      Labs: Basic Metabolic Panel: Recent Labs  Lab 11/11/19 2015 11/12/19 0125  NA 137 143  K 4.8 4.5  CL 103 106  CO2 25 27  GLUCOSE 98 99  BUN 18 19  CREATININE 0.96 0.93  CALCIUM 9.2 9.4   Liver Function Tests: Recent Labs  Lab 11/11/19 2015  AST 15  ALT 8  ALKPHOS 40  BILITOT 0.7  PROT 7.2  ALBUMIN 3.4*   No results for input(s): LIPASE, AMYLASE in the last 168 hours. No results for input(s): AMMONIA in the last 168 hours. CBC: Recent Labs  Lab 11/11/19 2015 11/12/19 0125  WBC 7.0 7.6  HGB 14.1 13.8  HCT 43.8 42.9  MCV 93.2 92.5  PLT 173 183   Cardiac Enzymes: Recent Labs  Lab 11/12/19 0125  CKTOTAL 143   BNP: BNP (last 3 results) No results for input(s): BNP in the last 8760 hours.  ProBNP (last 3 results) No results for input(s): PROBNP in the last 8760 hours.  CBG: No results for input(s): GLUCAP in the last 168 hours.     Signed:  Irine Seal MD.  [facility] 11/15/2019, 2:41 PM

## 2019-11-15 NOTE — Progress Notes (Signed)
Physical Therapy Treatment Patient Details Name: Leslie Lin MRN: [id number] DOB: 1950/11/28 Today's Date: 11/15/2019    History of Present Illness Pt admitted 2* pt increased weakness/lethargy and spouse's inability to care for in current condition.  Pt with hx of dementia, CAD, CVA and vertigo    PT Comments    Pt in bed "sleeping most of the day" per NT with untouched lunch tray at bedside and on 2 lts nasal O2.  Pt was easily aroused. General Comments: AxO x 3 slow to respond, sleepy but able to follow commands and express needs.  Assisted OOB to [facility] first while taking vitals: Supine               BP 109/55 HR 69 RA 96% EOB                  BP 109/79 HR 70 RA 94% BSC x 3 min      BP 120/83 HR 78 RA 97%  General bed mobility comments: cues for sequence and physical assist to complete tasks using bed pad to complete scooting and physical assist to achieve midline posture.  General transfer comment: assisted from elevated bed to [facility] then from [facility] to recliner.  Required increased assist with each transfer.  "I'm weak" stated pt. General Gait Details: pt was unable to take any functional steps and too weak to attempt gait.     Transfers only this session.  Pt plans to D/C to SNF for ST Rehab.    Follow Up Recommendations  SNF     Equipment Recommendations  None recommended by PT    Recommendations for Other Services       Precautions / Restrictions Precautions Precautions: Fall Precaution Comments: S/P CVA L Hemi, CP(per pt)    Mobility  Bed Mobility Overal bed mobility: Needs Assistance Bed Mobility: Supine to Sit     Supine to sit: Max assist;Total assist     General bed mobility comments: cues for sequence and physical assist to complete tasks using bed pad to complete scooting and physical assist to achieve midline posture.  Transfers Overall transfer level: Needs assistance Equipment used: Rolling walker (2 wheeled);None Transfers: Sit to/from [employer] Sit to Stand: Mod assist;+2 physical assistance;+2 safety/equipment;From elevated surface Stand pivot transfers: Mod assist;+2 physical assistance;+2 safety/equipment;Max assist       General transfer comment: assisted from elevated bed to [facility] then from [facility] to recliner.  Required increased assist with each transfer.  "I'm weak" stated pt.  Ambulation/Gait             General Gait Details: pt was unable to take any functional steps and too weak to attempt gait.     Transfers only this session.   Stairs             Wheelchair Mobility    Modified Rankin (Stroke Patients Only)       Balance                                            Cognition Arousal/Alertness: Awake/alert Behavior During Therapy: Flat affect Overall Cognitive Status: No family/caregiver present to determine baseline cognitive functioning                                 General Comments: AxO x  3 slow to respond, sleepy but able to follow commands and express needs      Exercises      General Comments        Pertinent Vitals/Pain Pain Assessment: No/denies pain    Home Living                      Prior Function            PT Goals (current goals can now be found in the care plan section) Progress towards PT goals: Progressing toward goals    Frequency    Min 2X/week      PT Plan Current plan remains appropriate    Co-evaluation              AM-PAC PT "6 Clicks" Mobility   Outcome Measure  Help needed turning from your back to your side while in a flat bed without using bedrails?: A Lot Help needed moving from lying on your back to sitting on the side of a flat bed without using bedrails?: A Lot Help needed moving to and from a bed to a chair (including a wheelchair)?: A Lot Help needed standing up from a chair using your arms (e.g., wheelchair or bedside chair)?: A Lot Help needed to walk in hospital room?:  Total Help needed climbing 3-5 steps with a railing? : Total 6 Click Score: 10    End of Session Equipment Utilized During Treatment: Gait belt Activity Tolerance: Patient limited by fatigue Patient left: in chair;with call bell/phone within reach;with chair alarm set;with nursing/sitter in room Nurse Communication: Mobility status PT Visit Diagnosis: Unsteadiness on feet (R26.81);Muscle weakness (generalized) (M62.81);Difficulty in walking, not elsewhere classified (R26.2)     Time: 1500-1530 PT Time Calculation (min) (ACUTE ONLY): 30 min  Charges:  $Therapeutic Activity: 23-37 mins                     Rica Koyanagi  PTA Acute  Rehabilitation Services Pager      [phone] Office      [phone]

## 2020-05-28 DEATH — deceased

## 2022-04-03 IMAGING — DX DG CHEST 1V PORT
1 series · 1 of 1 positions shown · non-contrast
Comparison: November 10, 2019

CLINICAL DATA: Lethargy

EXAM:
PORTABLE CHEST 1 VIEW

[chest ap]
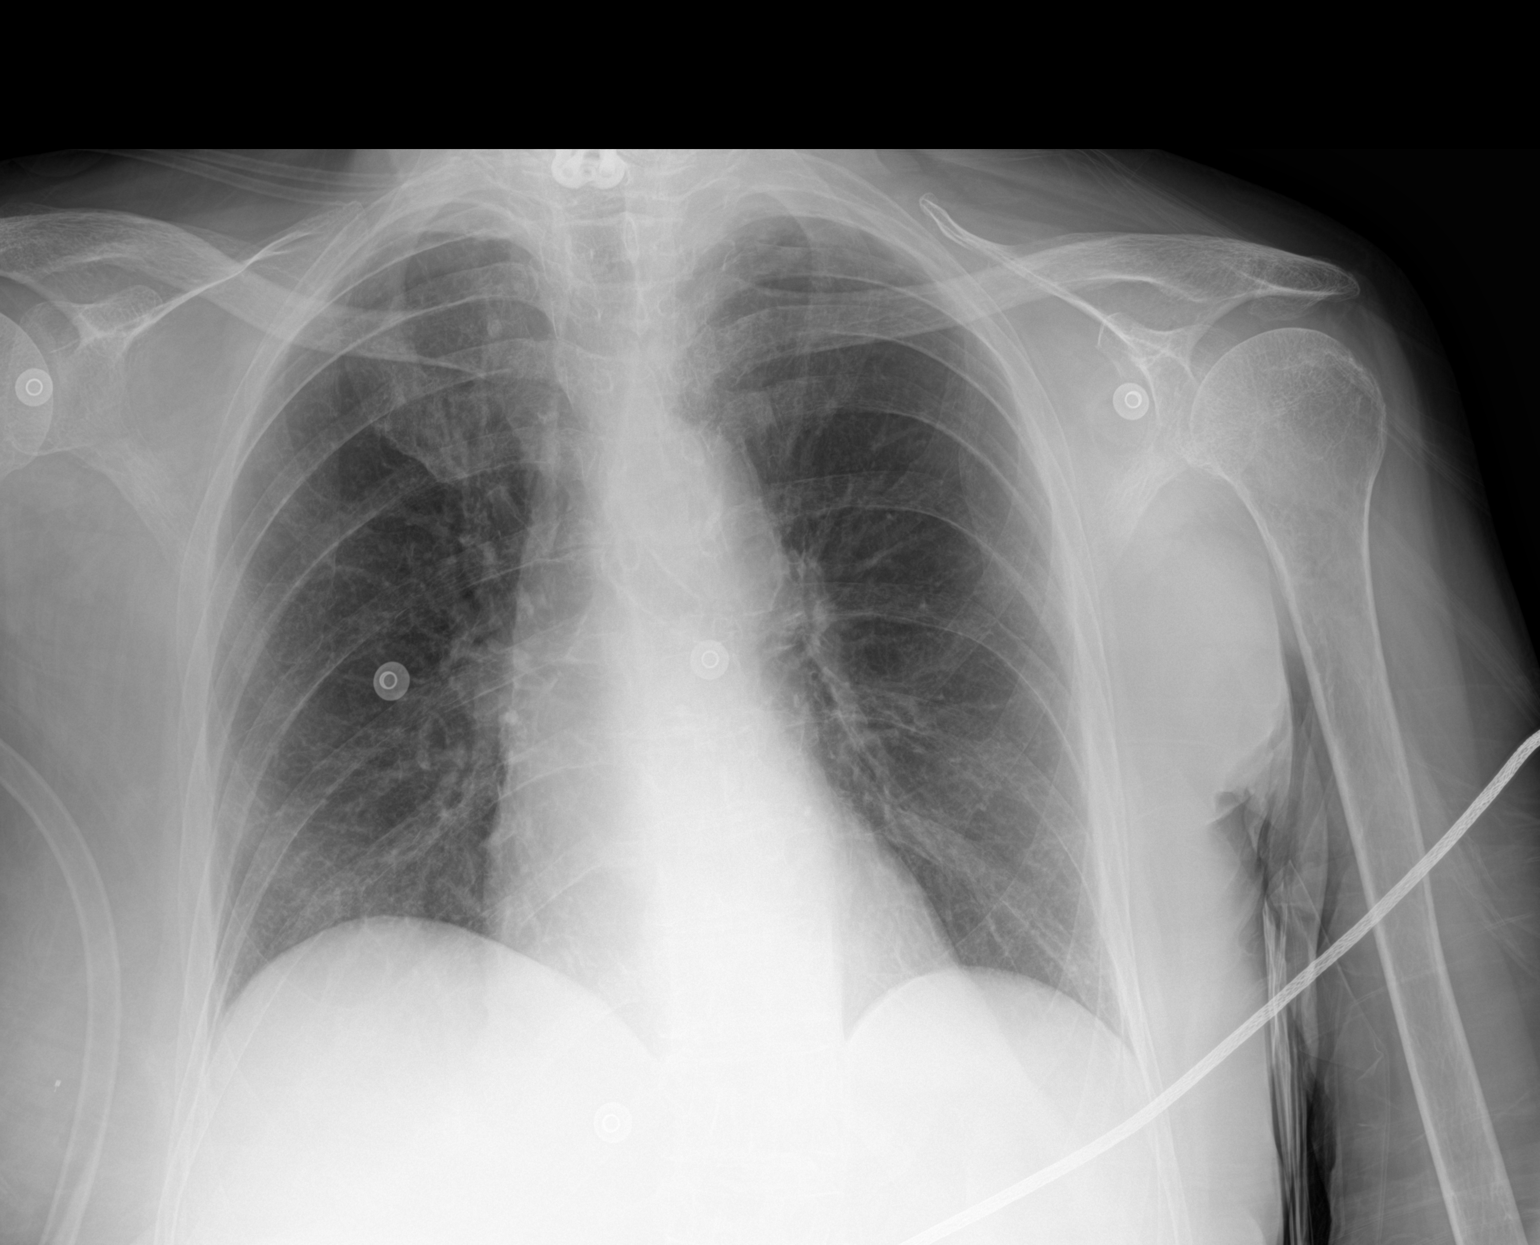

[1 of 1 positions shown; findings below may reference images not displayed]

FINDINGS: The heart size and mediastinal contours are within normal limits.
Aortic knob calcifications. Both lungs are clear. The visualized
skeletal structures are unremarkable.
IMPRESSION: No active disease.
